# Patient Record
Sex: Male | Born: 1960 | Race: White | Hispanic: No | Marital: Married | Smoking: Former smoker
Health system: Southern US, Community
[De-identification: ages and names within clinical notes are randomized; demographics above are authoritative.]

## PROBLEM LIST (undated history)

## (undated) DIAGNOSIS — E785 Hyperlipidemia, unspecified: Secondary | ICD-10-CM

## (undated) DIAGNOSIS — I10 Essential (primary) hypertension: Secondary | ICD-10-CM

## (undated) DIAGNOSIS — E669 Obesity, unspecified: Secondary | ICD-10-CM

## (undated) HISTORY — DX: Hyperlipidemia, unspecified: E78.5

## (undated) HISTORY — DX: Essential (primary) hypertension: I10

## (undated) HISTORY — DX: Obesity, unspecified: E66.9

---

## 2008-12-31 ENCOUNTER — Ambulatory Visit: Payer: Self-pay | Admitting: Family Medicine

## 2008-12-31 DIAGNOSIS — F17201 Nicotine dependence, unspecified, in remission: Secondary | ICD-10-CM | POA: Insufficient documentation

## 2008-12-31 DIAGNOSIS — E7800 Pure hypercholesterolemia, unspecified: Secondary | ICD-10-CM | POA: Insufficient documentation

## 2008-12-31 DIAGNOSIS — L989 Disorder of the skin and subcutaneous tissue, unspecified: Secondary | ICD-10-CM | POA: Insufficient documentation

## 2008-12-31 DIAGNOSIS — I10 Essential (primary) hypertension: Secondary | ICD-10-CM | POA: Insufficient documentation

## 2008-12-31 DIAGNOSIS — R030 Elevated blood-pressure reading, without diagnosis of hypertension: Secondary | ICD-10-CM | POA: Insufficient documentation

## 2008-12-31 LAB — CONVERTED CEMR LAB
ALT: 41 units/L (ref 0–53)
AST: 25 units/L (ref 0–37)
Albumin: 3.8 g/dL (ref 3.5–5.2)
BUN: 14 mg/dL (ref 6–23)
Chloride: 111 meq/L (ref 96–112)
Cholesterol: 198 mg/dL (ref 0–200)
Eosinophils Relative: 3.4 % (ref 0.0–5.0)
Glucose, Bld: 92 mg/dL (ref 70–99)
HCT: 43.5 % (ref 39.0–52.0)
Hemoglobin: 15.6 g/dL (ref 13.0–17.0)
Lymphs Abs: 2.1 10*3/uL (ref 0.7–4.0)
MCV: 91.1 fL (ref 78.0–100.0)
Monocytes Absolute: 0.7 10*3/uL (ref 0.1–1.0)
Neutro Abs: 3.6 10*3/uL (ref 1.4–7.7)
PSA: 0.76 ng/mL (ref 0.10–4.00)
Platelets: 207 10*3/uL (ref 150.0–400.0)
Potassium: 4.1 meq/L (ref 3.5–5.1)
RDW: 12.9 % (ref 11.5–14.6)
Sodium: 145 meq/L (ref 135–145)
Total Bilirubin: 0.7 mg/dL (ref 0.3–1.2)
WBC: 6.6 10*3/uL (ref 4.5–10.5)

## 2009-02-04 ENCOUNTER — Ambulatory Visit: Payer: Self-pay | Admitting: Family Medicine

## 2009-02-04 DIAGNOSIS — M79609 Pain in unspecified limb: Secondary | ICD-10-CM | POA: Insufficient documentation

## 2009-02-04 DIAGNOSIS — N509 Disorder of male genital organs, unspecified: Secondary | ICD-10-CM | POA: Insufficient documentation

## 2009-02-06 ENCOUNTER — Encounter: Payer: Self-pay | Admitting: Family Medicine

## 2009-07-09 ENCOUNTER — Encounter: Payer: Self-pay | Admitting: Family Medicine

## 2009-12-24 ENCOUNTER — Observation Stay: Payer: Self-pay | Admitting: Internal Medicine

## 2009-12-24 ENCOUNTER — Ambulatory Visit: Payer: Self-pay | Admitting: Cardiovascular Disease

## 2009-12-25 ENCOUNTER — Encounter: Payer: Self-pay | Admitting: Family Medicine

## 2009-12-25 ENCOUNTER — Encounter: Payer: Self-pay | Admitting: Cardiovascular Disease

## 2010-04-21 ENCOUNTER — Ambulatory Visit: Payer: Self-pay | Admitting: Family Medicine

## 2010-04-21 DIAGNOSIS — K5289 Other specified noninfective gastroenteritis and colitis: Secondary | ICD-10-CM | POA: Insufficient documentation

## 2010-06-16 ENCOUNTER — Other Ambulatory Visit: Payer: Self-pay | Admitting: Family Medicine

## 2010-06-16 ENCOUNTER — Ambulatory Visit
Admission: RE | Admit: 2010-06-16 | Discharge: 2010-06-16 | Payer: Self-pay | Source: Home / Self Care | Attending: Family Medicine | Admitting: Family Medicine

## 2010-06-16 DIAGNOSIS — G4730 Sleep apnea, unspecified: Secondary | ICD-10-CM | POA: Insufficient documentation

## 2010-06-16 LAB — CBC WITH DIFFERENTIAL/PLATELET
Basophils Relative: 0.3 % (ref 0.0–3.0)
Eosinophils Relative: 2.3 % (ref 0.0–5.0)
HCT: 45.4 % (ref 39.0–52.0)
Hemoglobin: 15.6 g/dL (ref 13.0–17.0)
Lymphs Abs: 2.2 10*3/uL (ref 0.7–4.0)
MCV: 90.7 fl (ref 78.0–100.0)
Monocytes Absolute: 0.7 10*3/uL (ref 0.1–1.0)
Neutro Abs: 5.2 10*3/uL (ref 1.4–7.7)
RBC: 5 Mil/uL (ref 4.22–5.81)
WBC: 8.4 10*3/uL (ref 4.5–10.5)

## 2010-06-16 LAB — HEPATIC FUNCTION PANEL
ALT: 30 U/L (ref 0–53)
AST: 20 U/L (ref 0–37)
Total Protein: 6.9 g/dL (ref 6.0–8.3)

## 2010-06-16 LAB — BASIC METABOLIC PANEL
BUN: 19 mg/dL (ref 6–23)
Chloride: 105 mEq/L (ref 96–112)
Potassium: 4.5 mEq/L (ref 3.5–5.1)
Sodium: 137 mEq/L (ref 135–145)

## 2010-06-16 LAB — LIPID PANEL
Cholesterol: 143 mg/dL (ref 0–200)
LDL Cholesterol: 93 mg/dL (ref 0–99)

## 2010-06-16 LAB — PSA: PSA: 0.46 ng/mL (ref 0.10–4.00)

## 2010-06-24 NOTE — Letter (Signed)
Summary: [facility]   Imported By: Lanelle Bal 01/06/2010 11:26:39  _____________________________________________________________________  External Attachment:    Type:   Image     Comment:   External Document

## 2010-06-24 NOTE — Letter (Signed)
Summary: Out of Work  [employer] at [facility]  [address] [place], [place] [address]   Phone: [phone]  Fax: [phone]    April 21, 2010   Employee:  Larey Seat    To Whom It May Concern:   For Medical reasons, please excuse the above named employee from work for the following dates:  Start:   04/21/2010  End:   04/23/2010  If you need additional information, please feel free to contact our office.         Sincerely,    Hannah Beat MD

## 2010-06-24 NOTE — Letter (Signed)
Summary: [employer] [place] / PRE-EXISTING CONDITION MED INFO REQUEST  [employer] [place] / PRE-EXISTING CONDITION MED INFO REQUEST   Imported By: Carin Primrose 07/09/2009 14:25:35  _____________________________________________________________________  External Attachment:    Type:   Image     Comment:   External Document

## 2010-06-24 NOTE — Letter (Signed)
Summary: Proof of Physical Form/[employer]  Proof of Physical Form/[employer]   Imported By: Lanelle Bal 05/27/2009 13:49:29  _____________________________________________________________________  External Attachment:    Type:   Image     Comment:   External Document

## 2010-06-24 NOTE — Assessment & Plan Note (Signed)
Summary: DIARRHEA X 4 DAYS   Vital Signs:  Patient profile:   50 year old male Height:      65 inches Weight:      242.0 pounds BMI:     40.42 Temp:     98.8 degrees F oral Pulse rate:   72 / minute Pulse rhythm:   regular BP sitting:   112 / 80  (left arm) Cuff size:   regular  Vitals Entered By: Benny Lennert CMA Duncan Dull) (April 21, 2010 3:43 PM)  History of Present Illness: Chief complaint diarrhea for 4 days  Acute Visit History:      He denies diarrhea, fever, nausea, sinus problems, sore throat, and vomiting.  Urine output has been slightly decreased.  There have been 9 diarrheal stools in the past 24 hours.  He is tolerating clear liquids.        Allergies (verified): No Known Drug Allergies  Past History:  Past medical, surgical, family and social histories (including risk factors) reviewed, and no changes noted (except as noted below).  Past Medical History: Reviewed history from 12/31/2008 and no changes required. HYPERCHOLESTEROLEMIA (ICD-272.0) Hypertension Morbid Obesity Tob Abuse  Family History: Reviewed history from 12/31/2008 and no changes required. M, d/c MI at 19, CAD F, d/c 70's, COPD and DM  Social History: Reviewed history from 12/31/2008 and no changes required. Occupation: former Hotel manager, disabled Married Current Smoker Alcohol use-no Drug use-no  Review of Systems       REVIEW OF SYSTEMS GEN: Acute illness details above. CV: No chest pain or SOB GI: No noted N or V Otherwise, pertinent positives and negatives are noted in the HPI.   Physical Exam  Additional Exam:  GEN: WDWN, NAD, Non-toxic, A & O x 3 HEENT: Atraumatic, Normocephalic. Neck supple. No masses, No LAD. Ears and Nose: No external deformity. CV: RRR, No M/G/R. No JVD. No thrill. No extra heart sounds. ABD: S, NT, ND, +BS - hyperactive. No rebound tenderness. No HSM.  EXTR: No c/c/e NEURO: Normal gait.  PSYCH: Normally interactive. Conversant. Not depressed or  anxious appearing.  Calm demeanor.     Impression & Recommendations:  Problem # 1:  GASTROENTERITIS (ICD-558.9)  His updated medication list for this problem includes:    Diphenoxylate-atropine 2.5-0.025 Mg Tabs (Diphenoxylate-atropine) .Marland Kitchen... 1 by mouth 4 times daily as needed diarrhea  Discussed use of medication and role of diet. Encouraged clear liquids and electrolyte replacement fluids. Instructed to call if any signs of worsening dehydration.   Problem # 2:  DIARRHEA (ICD-787.91)  His updated medication list for this problem includes:    Diphenoxylate-atropine 2.5-0.025 Mg Tabs (Diphenoxylate-atropine) .Marland Kitchen... 1 by mouth 4 times daily as needed diarrhea  Complete Medication List: 1)  Aspirin 81 Mg Tbec (Aspirin) .... One by mouth every day 2)  Simvastatin 40 Mg Tabs (Simvastatin) .Marland Kitchen.. 1 by mouth at bedtime 3)  Lisinopril 20 Mg Tabs (Lisinopril) .Marland Kitchen.. 1 by mouth daily 4)  Diphenoxylate-atropine 2.5-0.025 Mg Tabs (Diphenoxylate-atropine) .Marland Kitchen.. 1 by mouth 4 times daily as needed diarrhea Prescriptions: DIPHENOXYLATE-ATROPINE 2.5-0.025 MG TABS (DIPHENOXYLATE-ATROPINE) 1 by mouth 4 times daily as needed diarrhea  #30 x 0   Entered and Authorized by:   Hannah Beat MD   Signed by:   Hannah Beat MD on 04/21/2010   Method used:   Print then Give to Patient   RxID:   [id number]    Orders Added: 1)  Est. Patient Level IV [24401]    Current Allergies (reviewed today): No  known allergies

## 2010-06-26 NOTE — Assessment & Plan Note (Signed)
Summary: cpx/alc   Vital Signs:  Patient profile:   50 year old male Height:      65 inches Weight:      253.25 pounds BMI:     42.30 Temp:     98.1 degrees F oral Pulse rate:   72 / minute Pulse rhythm:   regular BP sitting:   110 / 70  (left arm) Cuff size:   large  Vitals Entered By: Benny Lennert CMA Duncan Dull) (June 16, 2010 8:05 AM)  History of Present Illness: Chief complaint cpx  50 year old male:  Overall, feels well, but continues to gain weight. Prior to [employer] discharge, was not obese. Now BMI = 42. Poor diet. Snacks a lot, eating cheese when getting home, volume of intake a lot.  OSA: Sometimes wakes up with morning headaches.  Snores very loudly.  Much weight gain 11 pound weight gain in a couple of months Feels tired some during the day Sometimes has to get up at three in the morning Then has to Working 5-3. 7-8 hours, napping in the afternoon.  Napping most days.  Wife notices that he stops breathing in the middle of the night.   Eating habits not the best   Preventive Screening-Counseling & Management  Alcohol-Tobacco     Alcohol drinks/day: 0     Alcohol Counseling: not indicated; patient does not drink     Smoking Status: current     Smoking Cessation Counseling: yes     Smoke Cessation Stage: ready     Packs/Day: 1.0     Tobacco Counseling: to quit use of tobacco products     Passive Smoke Counseling: to avoid passive smoke exposure  Caffeine-Diet-Exercise     Diet Comments: suboptimal, working out more     Diet Counseling: to improve diet; diet is suboptimal     Does Patient Exercise: yes     Type of exercise: just started going to gym     Exercise (avg: min/session): 30-60     Times/week: 3  Hep-HIV-STD-Contraception     Hepatitis Risk: no risk noted     HIV Risk: no risk noted     STD Risk: no risk noted     Contraception Counseling: not indicated; no questions/concerns expressed     Testicular SE Education/Counseling to  perform regular STE      Sexual History:  currently monogamous.        Drug Use:  never.    Allergies (verified): No Known Drug Allergies  Past History:  Past medical, surgical, family and social histories (including risk factors) reviewed, and no changes noted (except as noted below).  Past Medical History: Reviewed history from 12/31/2008 and no changes required. HYPERCHOLESTEROLEMIA (ICD-272.0) Hypertension Morbid Obesity Tob Abuse  Family History: Reviewed history from 12/31/2008 and no changes required. M, d/c MI at 72, CAD F, d/c 70's, COPD and DM  Social History: Reviewed history from 12/31/2008 and no changes required. Occupation: former Hotel manager, disabled Married Current Smoker Alcohol use-no Drug use-no  Review of Systems      See HPI General:  Complains of fatigue and sleep disorder.  Other ROS:  General: Denies fever, chills, sweats, anorexia, weakness, malaise, AS ABOVE Eyes: Denies blurring, vision loss ENT: Denies earache, nasal congestion, nosebleeds, sore throat, and hoarseness.  Cardiovascular: Denies chest pains, palpitations, syncope, dyspnea on exertion,  Respiratory: Denies cough, dyspnea at rest, excessive sputum,wheeezing GI: Denies nausea, vomiting, diarrhea, constipation, change in bowel habits, abdominal pain, melena, hematochezia GU: Denies  dysuria, hematuria, discharge, urinary frequency, urinary hesitancy, nocturia, incontinence, genital sores, decreased libido Musculoskeletal: Denies back pain, joint pain Derm: Denies rash, itching Neuro: Denies  paresthesias, frequent falls, frequent headaches, and difficulty walking.  Psych: Denies depression, anxiety Endocrine: Denies cold intolerance, heat intolerance, polydipsia, polyphagia, polyuria, and unusual weight change.  Heme: Denies enlarged lymph nodes Allergy: No hayfever   Otherwise, the pertinent positives and negatives are listed above and in the HPI, otherwise a full review of  systems has been reviewed and is negative unless noted positive.    Impression & Recommendations:  Problem # 1:  HEALTH MAINTENANCE EXAM (ICD-V70.0) The patient's preventative maintenance and recommended screening tests for an annual wellness exam were reviewed in full today. Brought up to date unless services declined.  Counselled on the importance of diet, exercise, and its role in overall health and mortality. The patient's FH and SH was reviewed, including their home life, tobacco status, and drug and alcohol status.   dramatically needs to lose weight, morbidly obese, exercise and diet changes discussed  Problem # 2:  SLEEP APNEA (ICD-780.57) Assessment: New history highly suggestive of sleep apnea  Consult Dr. Vickey Huger for Sleep Medicine consult.   Orders: Sleep Disorder Referral (Sleep Disorder)  Problem # 3:  HYPERTENSION (ICD-401.9) Assessment: Unchanged  His updated medication list for this problem includes:    Lisinopril 20 Mg Tabs (Lisinopril) .Marland Kitchen... 1 by mouth daily  BP today: 110/70 Prior BP: 112/80 (04/21/2010)  Labs Reviewed: K+: 4.1 (12/31/2008) Creat: : 0.8 (12/31/2008)   Chol: 198 (12/31/2008)   HDL: 31.10 (12/31/2008)   LDL: 147 (12/31/2008)   TG: 101.0 (12/31/2008)  Problem # 4:  HYPERCHOLESTEROLEMIA (ICD-272.0) labs today  His updated medication list for this problem includes:    Simvastatin 40 Mg Tabs (Simvastatin) .Marland Kitchen... 1 by mouth at bedtime  Orders: TLB-Lipid Panel (80061-LIPID) TLB-Hepatic/Liver Function Pnl (80076-HEPATIC)  Complete Medication List: 1)  Aspirin 81 Mg Tbec (Aspirin) .... One by mouth every day 2)  Simvastatin 40 Mg Tabs (Simvastatin) .Marland Kitchen.. 1 by mouth at bedtime 3)  Lisinopril 20 Mg Tabs (Lisinopril) .Marland Kitchen.. 1 by mouth daily  Other Orders: Venipuncture (04540) TLB-BMP (Basic Metabolic Panel-BMET) (80048-METABOL) TLB-CBC Platelet - w/Differential (85025-CBCD) TLB-PSA (Prostate Specific Antigen) (84153-PSA)  Patient  Instructions: 1)  Referral Appointment Information 2)  Day/Date: 3)  Time: 4)  Place/MD: 5)  Address: 6)  Phone/Fax: 7)  Patient given appointment information. Information/Orders faxed/mailed.    Orders Added: 1)  Venipuncture [36415] 2)  TLB-Lipid Panel [80061-LIPID] 3)  TLB-BMP (Basic Metabolic Panel-BMET) [80048-METABOL] 4)  TLB-CBC Platelet - w/Differential [85025-CBCD] 5)  TLB-Hepatic/Liver Function Pnl [80076-HEPATIC] 6)  TLB-PSA (Prostate Specific Antigen) [84153-PSA] 7)  Sleep Disorder Referral [Sleep Disorder] 8)  Est. Patient 40-64 years [99396] 9)  Est. Patient Level III [98119]   Immunization History:  Tetanus/Td Immunization History:    Tetanus/Td:  historical (10/23/2005)   Immunization History:  Tetanus/Td Immunization History:    Tetanus/Td:  Historical (10/23/2005)  Current Allergies (reviewed today): No known allergies   Prevention & Chronic Care Immunizations   Influenza vaccine: Not documented   Influenza vaccine deferral: Not available  (06/16/2010)    Tetanus booster: 10/23/2005: Historical    Pneumococcal vaccine: Not documented  Other Screening   Smoking status: current  (06/16/2010)   Smoking cessation counseling: yes  (06/16/2010)  Lipids   Total Cholesterol: 198  (12/31/2008)   LDL: 147  (12/31/2008)   LDL Direct: Not documented   HDL: 31.10  (12/31/2008)   Triglycerides:  101.0  (12/31/2008)    SGOT (AST): 25  (12/31/2008)   SGPT (ALT): 41  (12/31/2008)   Alkaline phosphatase: 62  (12/31/2008)   Total bilirubin: 0.7  (12/31/2008)  Hypertension   Last Blood Pressure: 110 / 70  (06/16/2010)   Serum creatinine: 0.8  (12/31/2008)   Serum potassium 4.1  (12/31/2008)  Self-Management Support :    Hypertension self-management support: Not documented    Lipid self-management support: Not documented    Physical Exam General Appearance: well developed, well nourished, no acute distress Eyes: conjunctiva and lids normal,  PERRLA, EOMI Ears, Nose, Mouth, Throat: TM clear, nares clear, oral exam WNL Neck: supple, no lymphadenopathy, no thyromegaly, no JVD Respiratory: clear to auscultation and percussion, respiratory effort normal Cardiovascular: regular rate and rhythm, S1-S2, no murmur, rub or gallop, no bruits, peripheral pulses normal and symmetric, no cyanosis, clubbing, edema or varicosities Chest: no scars, masses, tenderness; no asymmetry, skin changes, nipple discharge, no gynecomastia   Gastrointestinal: soft, non-tender; no hepatosplenomegaly, masses; active bowel sounds all quadrants,  no masses, tenderness, hemorrhoids  Genitourinary: no hernia, testicular mass, penile discharge, priapism or prostate enlargement Lymphatic: no cervical, axillary or inguinal adenopathy Musculoskeletal: gait normal, muscle tone and strength WNL, no joint swelling, effusions, discoloration, crepitus  Skin: clear, good turgor, color WNL, no rashes, lesions, or ulcerations Neurologic: normal mental status, normal reflexes, normal strength, sensation, and motion Psychiatric: alert; oriented to person, place and time Other Exam:

## 2011-05-12 ENCOUNTER — Telehealth: Payer: Self-pay | Admitting: Internal Medicine

## 2011-05-12 ENCOUNTER — Ambulatory Visit (INDEPENDENT_AMBULATORY_CARE_PROVIDER_SITE_OTHER): Payer: Managed Care, Other (non HMO) | Admitting: Family Medicine

## 2011-05-12 ENCOUNTER — Encounter: Payer: Self-pay | Admitting: Family Medicine

## 2011-05-12 ENCOUNTER — Encounter: Payer: Self-pay | Admitting: *Deleted

## 2011-05-12 VITALS — BP 120/70 | HR 97 | Temp 99.7°F | Ht 64.5 in | Wt 261.0 lb

## 2011-05-12 DIAGNOSIS — J111 Influenza due to unidentified influenza virus with other respiratory manifestations: Secondary | ICD-10-CM

## 2011-05-12 MED ORDER — OSELTAMIVIR PHOSPHATE 75 MG PO CAPS: 75.0000 mg | ORAL_CAPSULE | Freq: Two times a day (BID) | ORAL | Status: AC

## 2011-05-12 MED ORDER — HYDROCODONE-HOMATROPINE 5-1.5 MG/5ML PO SYRP: ORAL_SOLUTION | ORAL | Status: AC

## 2011-05-12 NOTE — Telephone Encounter (Signed)
Patient's wife called in and stated he has a fever, body aches and congestion.  Made an appointment for today.

## 2011-05-12 NOTE — Progress Notes (Signed)
Patient Name: Kevin [name] Date of Birth: 1960/10/29 Medical Record Number: [id number] Gender: male  History of Present Illness:  Kevin [name] presents with runny nose, sneezing, cough, sore throat, malaise, myalgias, arthralgia, chills, and fever. Less than 3 days ago, started to get worse, then al the sudden, started toget the chills, coughing a lot, whole body feels warm and hurting. Aching.   ? recent exposure to others with similar symptoms.   The patent denies sore throat as the primary complaint. Denies sthortness of breath/wheezing, otalgia, facial pain, abdominal pain, changes in bowel or bladder.  Generally feels terrible  Tmax: 102  PMH, PHS, Allergies, Problem List, Medications, Family History, and Social History have all been reviewed.  Review of Systems: as above, eating and drinking - tolerating PO. Urinating normally. No excessive vomitting or diarrhea. O/w as above.  Physical Exam:  Filed Vitals:   05/12/11 1552  BP: 120/70  Pulse: 97  Temp: 99.7 F (37.6 C)  TempSrc: Oral  Height: 5' 4.5" (1.638 m)  Weight: 261 lb (118.389 kg)  SpO2: 98%    Gen: WDWN, NAD; A & O x3, cooperative. Pleasant.Globally Non-toxic HEENT: Normocephalic and atraumatic. Throat clear, w/o exudate, R TM clear, L TM - good landmarks, No fluid present. rhinnorhea. No frontal or maxillary sinus T. MMM NECK: Anterior cervical  LAD is absent CV: RRR, No M/G/R, cap refill <2 sec PULM: Breathing comfortably in no respiratory distress. no wheezing, crackles, rhonchi ABD: S,NT,ND,+BS. No HSM. No rebound. EXT: No c/c/e PSYCH: Friendly, good eye contact MSK: Nml gait  A/P: 1. Influenza: The patient's clinical exam and history is consistent with a diagnosis of influenza. Tamiflu, hycodan  Supportive care. Fluids. Cough medicines as needed  Anti-pyretics. Detailed in p/i  Infection control emphasized, including OOW or school until AF 24 hours.

## 2011-05-12 NOTE — Patient Instructions (Signed)
INFLUENZA Viral illness: High fever, headache, bad cough, body aches, sore throat, sometimes nausea, vomitting, diarrhea.  Usually quick onset  TREATMENT 1. Decongestant: Sudafed (NOT IF HIGH BLOOD PRESSURE) 2. Nose Sprays: Saline nasal spray, Can use Afrin or Neosynephrine, but no more than 3 days 3. Cough Suppressants: DM portion of cough med, or Strong prescription 4. Expectorant: Liquify Secretions (Guaifenesin) 5. Example: Mucinex (expectorant) or Mucinex-D (expectorant and decongestant) 6. Take all prescribed meds 7. Anti-virals may be used if caught early or in high risk people. (Do NOT if pregnant, breast feeding, seizure disorder) 8. Rest helpful, but move some during day 9. Breathe moist air: Humidifier, Vaporizer, or steam from shower 10. No work or school until no fever for 24 hrs WITH NO Tylenol or Ibuprofen. 11. Pneumonia on top of Flu is possible, if you are doing poorly particularly if a smoker or have COPD, please let us know. 12. Wash hands and cover mouth with cough  

## 2011-06-19 ENCOUNTER — Other Ambulatory Visit: Payer: Self-pay | Admitting: Family Medicine

## 2012-07-18 ENCOUNTER — Other Ambulatory Visit: Payer: Self-pay | Admitting: Family Medicine

## 2012-07-24 ENCOUNTER — Other Ambulatory Visit: Payer: Self-pay | Admitting: Family Medicine

## 2013-07-03 ENCOUNTER — Other Ambulatory Visit: Payer: Self-pay | Admitting: Family Medicine

## 2013-07-03 NOTE — Telephone Encounter (Signed)
Last office visit 05/12/2011.  Ok to refill?

## 2013-07-03 NOTE — Telephone Encounter (Signed)
Ok to refill 30, 1 refill  F/u CPX

## 2013-08-05 ENCOUNTER — Other Ambulatory Visit: Payer: Self-pay | Admitting: Family Medicine

## 2013-08-09 ENCOUNTER — Other Ambulatory Visit: Payer: Self-pay

## 2013-08-09 MED ORDER — LISINOPRIL 20 MG PO TABS: ORAL_TABLET | ORAL | Status: DC

## 2013-08-09 NOTE — Telephone Encounter (Signed)
Kevin [name] left v/m requesting refill lisinopril; pt last seen 05/12/11 and does not have future appt.Please advise.

## 2013-08-09 NOTE — Telephone Encounter (Signed)
F/u cpx  30, #2 ref

## 2013-08-30 ENCOUNTER — Ambulatory Visit (INDEPENDENT_AMBULATORY_CARE_PROVIDER_SITE_OTHER): Payer: Managed Care, Other (non HMO) | Admitting: Family Medicine

## 2013-08-30 ENCOUNTER — Encounter: Payer: Self-pay | Admitting: Family Medicine

## 2013-08-30 VITALS — BP 110/76 | HR 93 | Temp 97.8°F | Ht 64.25 in | Wt 273.0 lb

## 2013-08-30 DIAGNOSIS — I10 Essential (primary) hypertension: Secondary | ICD-10-CM

## 2013-08-30 DIAGNOSIS — Z1211 Encounter for screening for malignant neoplasm of colon: Secondary | ICD-10-CM

## 2013-08-30 DIAGNOSIS — Z0000 Encounter for general adult medical examination without abnormal findings: Secondary | ICD-10-CM

## 2013-08-30 DIAGNOSIS — Z125 Encounter for screening for malignant neoplasm of prostate: Secondary | ICD-10-CM

## 2013-08-30 DIAGNOSIS — Z79899 Other long term (current) drug therapy: Secondary | ICD-10-CM

## 2013-08-30 DIAGNOSIS — F17200 Nicotine dependence, unspecified, uncomplicated: Secondary | ICD-10-CM

## 2013-08-30 DIAGNOSIS — E7800 Pure hypercholesterolemia, unspecified: Secondary | ICD-10-CM

## 2013-08-30 DIAGNOSIS — G4730 Sleep apnea, unspecified: Secondary | ICD-10-CM

## 2013-08-30 MED ORDER — LISINOPRIL 20 MG PO TABS: ORAL_TABLET | ORAL | Status: DC

## 2013-08-30 MED ORDER — SIMVASTATIN 40 MG PO TABS: 40.0000 mg | ORAL_TABLET | Freq: Every day | ORAL | Status: DC

## 2013-08-30 NOTE — Progress Notes (Signed)
Date:  08/30/2013   Name:  Kevin [name]   DOB:  [birth year]   MRN:  [id number] Gender: male Age: 53 y.o.  Primary Physician:  Hannah Beat, MD   Chief Complaint: Medication Refill   Subjective:   History of Present Illness:  Kevin [name] is a 53 y.o. pleasant patient who presents with the following:  Preventative Health Maintenance Visit:  Health Maintenance Summary Reviewed and updated, unless pt declines services.  Tobacco History Reviewed. Still smoking about every other day, but he has cut down. Alcohol: No concerns, no excessive use Exercise Habits: minimal STD concerns: no risk or activity to increase risk Drug Use: None Encouraged self-testicular check  Health Maintenance  Topic Date Due  . Colonoscopy  11/30/2011  . Influenza Vaccine  12/23/2013  . Tetanus/tdap  10/24/2015    Immunization History  Administered Date(s) Administered  . Td 10/23/2005    Patient Active Problem List   Diagnosis Date Noted  . Severe obesity (BMI >= 40) 08/31/2013  . SLEEP APNEA 06/16/2010  . HYPERCHOLESTEROLEMIA 12/31/2008  . TOBACCO USE 12/31/2008  . HYPERTENSION 12/31/2008    Past Medical History  Diagnosis Date  . Hyperlipidemia   . Hypertension   . Obesity     History reviewed. No pertinent past surgical history.  History   Social History  . Marital Status: Married    Spouse Name: N/A    Number of Children: N/A  . 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 Smoking status: Current Some Day Smoker  . Smokeless tobacco: Never Used  . Alcohol Use: No  . Drug Use: No  . Sexual Activity: Not on file   Other Topics Concern  . Not on file   Social History Narrative  . No narrative on file    Family History  Problem Relation Age of Onset  . Heart attack Mother   . Coronary artery disease Mother   . COPD Father   . Diabetes Father     No Known Allergies  Medication list has been reviewed  and updated.  Review of Systems:  General: Denies fever, chills, sweats. No significant weight loss. Eyes: Denies blurring,significant itching ENT: Denies earache, sore throat, and hoarseness. Cardiovascular: Denies chest pains, palpitations, dyspnea on exertion Respiratory: Denies cough, dyspnea at rest,wheeezing Breast: no concerns about lumps GI: Denies nausea, vomiting, diarrhea, constipation, change in bowel habits, abdominal pain, melena, hematochezia GU: Denies penile discharge, ED, urinary flow / outflow problems. No STD concerns. Musculoskeletal: Denies back pain, joint pain Derm: Denies rash, itching Neuro: Denies  paresthesias, frequent falls, frequent headaches Psych: Denies depression, anxiety Endocrine: Denies cold intolerance, heat intolerance, polydipsia Heme: Denies enlarged lymph nodes Allergy: No hayfever  Objective:   Physical Examination: BP 110/76  Pulse 93  Temp(Src) 97.8 F (36.6 C) (Oral)  Ht 5' 4.25" (1.632 m)  Wt 273 lb (123.832 kg)  BMI 46.49 kg/m2 Ideal Body Weight: Weight in (lb) to have BMI = 25: 146.5  GEN: well developed, well nourished, no acute distress Eyes: conjunctiva and lids normal, PERRLA, EOMI ENT: TM clear, nares clear, oral exam WNL Neck: supple, no lymphadenopathy, no thyromegaly, no JVD Pulm: clear to auscultation and percussion, respiratory effort normal CV: regular rate and rhythm, S1-S2, no murmur, rub or gallop, no bruits, peripheral pulses normal and symmetric, no cyanosis, clubbing, edema or varicosities GI: soft, non-tender; no hepatosplenomegaly, masses; active bowel sounds all quadrants GU: no hernia, testicular  mass, penile discharge Lymph: no cervical, axillary or inguinal adenopathy MSK: gait normal, muscle tone and strength WNL, no joint swelling, effusions, discoloration, crepitus  SKIN: clear, good turgor, color WNL, no rashes, lesions, or ulcerations Neuro: normal mental status, normal strength, sensation, and  motion Psych: alert; oriented to person, place and time, normally interactive and not anxious or depressed in appearance.  Needs current labs.  Lipids:    Component Value Date/Time   CHOL 143 06/16/2010 0834   TRIG 104.0 06/16/2010 0834   HDL 28.90* 06/16/2010 0834   VLDL 20.8 06/16/2010 0834   CHOLHDL 5 06/16/2010 0834    CBC:    Component Value Date/Time   WBC 8.4 06/16/2010 0834   HGB 15.6 06/16/2010 0834   HCT 45.4 06/16/2010 0834   PLT 238.0 06/16/2010 0834   MCV 90.7 06/16/2010 0834   NEUTROABS 5.2 06/16/2010 0834   LYMPHSABS 2.2 06/16/2010 0834   MONOABS 0.7 06/16/2010 0834   EOSABS 0.2 06/16/2010 0834   BASOSABS 0.0 06/16/2010 0834    Basic Metabolic Panel:    Component Value Date/Time   NA 137 06/16/2010 0834   K 4.5 06/16/2010 0834   CL 105 06/16/2010 0834   CO2 24 06/16/2010 0834   BUN 19 06/16/2010 0834   CREATININE 0.9 06/16/2010 0834   GLUCOSE 85 06/16/2010 0834   CALCIUM 9.2 06/16/2010 0834    Lab Results  Component Value Date   ALT 30 06/16/2010   AST 20 06/16/2010   ALKPHOS 62 06/16/2010   BILITOT 0.7 06/16/2010    No results found for this basename: TSH    Lab Results  Component Value Date   PSA 0.46 06/16/2010   PSA 0.76 12/31/2008    Assessment & Plan:   Health Maintenance Exam: The patient's preventative maintenance and recommended screening tests for an annual wellness exam were reviewed in full today. Brought up to date unless services declined.  Counselled on the importance of diet, exercise, and its role in overall health and mortality. The patient's FH and SH was reviewed, including their home life, tobacco status, and drug and alcohol status.  Routine general medical examination at a health care facility  HYPERCHOLESTEROLEMIA - Plan: Lipid panel  HYPERTENSION  SLEEP APNEA  TOBACCO USE  OBESITY, MORBID  Special screening for malignant neoplasm of prostate - Plan: PSA  Encounter for long-term (current) use of other medications - Plan: Basic  metabolic panel, CBC with Differential, Hepatic function panel  Special screening for malignant neoplasms, colon - Plan: Ambulatory referral to Gastroenterology  Health maintenance. Labs Refill meds Colon  F/u with Dr. Vickey Hugerohmeier about sleep study  5 minutes spent in counselling: The patient does smoke cigarettes, and we discussed that tobacco is harmful to one's overall health, and I made the recommendation to quit tobacco products.   Follow-up: Return in about 1 year (around 08/31/2014).  New Prescriptions   No medications on file   Orders Placed This Encounter  Procedures  . Basic metabolic panel  . CBC with Differential  . Hepatic function panel  . Lipid panel  . PSA  . Ambulatory referral to Gastroenterology    Signed,  Karleen HampshireSpencer T. Starkisha Tullis, MD, CAQ Sports Medicine  [facility] [address] [place] [address] Phone: [phone] Fax: [phone]  Patient's Medications  New Prescriptions   No medications on file  Previous Medications   ASPIRIN 81 MG TABLET    Take 81 mg by mouth daily.  MULTIPLE VITAMINS-MINERALS (MULTIVITAMIN WITH MINERALS) TABLET    Take 1 tablet by mouth daily.  Modified Medications   Modified Medication Previous Medication   LISINOPRIL (PRINIVIL,ZESTRIL) 20 MG TABLET lisinopril (PRINIVIL,ZESTRIL) 20 MG tablet      TAKE 1 TABLET BY MOUTH EVERY DAY    TAKE 1 TABLET BY MOUTH EVERY DAY   SIMVASTATIN (ZOCOR) 40 MG TABLET simvastatin (ZOCOR) 40 MG tablet      Take 1 tablet (40 mg total) by mouth daily.    TAKE 1 TABLET BY MOUTH AT BEDTIME  Discontinued Medications   No medications on file

## 2013-08-30 NOTE — Patient Instructions (Addendum)
Call Dr. Oliva Bustardohmeier's office to set up a sleep study.  REFERRALS TO SPECIALISTS, SPECIAL TESTS (MRI, CT, ULTRASOUNDS)  GO THE WAITING ROOM AND TELL CHECK IN YOU NEED HELP WITH A REFERRAL. Either MARION or LINDA will help you set it up.  If it is between 1-2 PM they may be at lunch.  After 5 PM, they will likely be at home.  They will call you, so please make sure the office has your correct phone number.  Referrals sometimes can be done same day if urgent, but others can take 2 or 3 days to get an appointment. Starting in 2015, many of the new Medicare insurance plans and Affordable Care Act (Obamacare) Health plans offered take much longer for referrals. They have added additional paperwork and steps.  MRI's and CT's can take up to a week for the test. (Emergencies like strokes take precedence. I will tell you if you have an emergency.)   If your referral is to an [facility] [place] [facility], their office may contact you directly prior to our office reaching you.  -- Examples: [facility], [facility], [facility], [facility], [facility], and many more.  Specialist appointment times vary a great deal, mostly on the specialist's schedule and if they have openings. -- Our office tries to get you in as fast as possible. -- Some specialists have very long wait times. (Example. Dermatology. Usually months) -- If you have a true emergency like new cancer, we work to get you in ASAP.

## 2013-08-30 NOTE — Progress Notes (Signed)
Pre visit review using our clinic review tool, if applicable. No additional management support is needed unless otherwise documented below in the visit note. 

## 2013-08-31 ENCOUNTER — Encounter: Payer: Self-pay | Admitting: *Deleted

## 2013-08-31 DIAGNOSIS — Z6841 Body Mass Index (BMI) 40.0 and over, adult: Secondary | ICD-10-CM

## 2013-08-31 HISTORY — DX: Body Mass Index (BMI) 40.0 and over, adult: Z684

## 2013-08-31 LAB — CBC WITH DIFFERENTIAL/PLATELET
Basophils Absolute: 0 10*3/uL (ref 0.0–0.1)
Basophils Relative: 0.3 % (ref 0.0–3.0)
Eosinophils Absolute: 0.3 10*3/uL (ref 0.0–0.7)
Eosinophils Relative: 2.9 % (ref 0.0–5.0)
HEMATOCRIT: 46 % (ref 39.0–52.0)
HEMOGLOBIN: 15.7 g/dL (ref 13.0–17.0)
LYMPHS PCT: 26.2 % (ref 12.0–46.0)
Lymphs Abs: 2.6 10*3/uL (ref 0.7–4.0)
MCHC: 34.1 g/dL (ref 30.0–36.0)
MCV: 90.6 fl (ref 78.0–100.0)
Monocytes Absolute: 0.6 10*3/uL (ref 0.1–1.0)
Monocytes Relative: 6 % (ref 3.0–12.0)
NEUTROS ABS: 6.5 10*3/uL (ref 1.4–7.7)
Neutrophils Relative %: 64.6 % (ref 43.0–77.0)
PLATELETS: 249 10*3/uL (ref 150.0–400.0)
RBC: 5.07 Mil/uL (ref 4.22–5.81)
RDW: 13 % (ref 11.5–14.6)
WBC: 10.1 10*3/uL (ref 4.5–10.5)

## 2013-08-31 LAB — LIPID PANEL
CHOLESTEROL: 146 mg/dL (ref 0–200)
HDL: 30.6 mg/dL — ABNORMAL LOW (ref >39.00)
LDL Cholesterol: 68 mg/dL (ref 0–99)
TRIGLYCERIDES: 236 mg/dL — AB (ref 0.0–149.0)
Total CHOL/HDL Ratio: 5
VLDL: 47.2 mg/dL — AB (ref 0.0–40.0)

## 2013-08-31 LAB — BASIC METABOLIC PANEL
BUN: 15 mg/dL (ref 6–23)
CALCIUM: 9.7 mg/dL (ref 8.4–10.5)
CO2: 29 mEq/L (ref 19–32)
Chloride: 103 mEq/L (ref 96–112)
Creatinine, Ser: 1 mg/dL (ref 0.4–1.5)
GFR: 85.45 mL/min (ref >60.00)
GLUCOSE: 88 mg/dL (ref 70–99)
Potassium: 4.6 mEq/L (ref 3.5–5.1)
Sodium: 140 mEq/L (ref 135–145)

## 2013-08-31 LAB — HEPATIC FUNCTION PANEL
ALBUMIN: 4.1 g/dL (ref 3.5–5.2)
ALT: 43 U/L (ref 0–53)
AST: 23 U/L (ref 0–37)
Alkaline Phosphatase: 60 U/L (ref 39–117)
Bilirubin, Direct: 0.1 mg/dL (ref 0.0–0.3)
Total Bilirubin: 0.6 mg/dL (ref 0.3–1.2)
Total Protein: 7.1 g/dL (ref 6.0–8.3)

## 2013-08-31 LAB — PSA: PSA: 0.71 ng/mL (ref 0.10–4.00)

## 2013-12-22 ENCOUNTER — Ambulatory Visit: Payer: Self-pay | Admitting: Unknown Physician Specialty

## 2013-12-22 LAB — HM COLONOSCOPY

## 2013-12-26 ENCOUNTER — Encounter: Payer: Self-pay | Admitting: Family Medicine

## 2013-12-26 LAB — PATHOLOGY REPORT

## 2014-01-30 ENCOUNTER — Ambulatory Visit (INDEPENDENT_AMBULATORY_CARE_PROVIDER_SITE_OTHER): Admitting: Family Medicine

## 2014-01-30 ENCOUNTER — Encounter: Payer: Self-pay | Admitting: Family Medicine

## 2014-01-30 VITALS — BP 122/80 | HR 92 | Temp 98.1°F | Wt 282.0 lb

## 2014-01-30 DIAGNOSIS — H109 Unspecified conjunctivitis: Secondary | ICD-10-CM | POA: Insufficient documentation

## 2014-01-30 MED ORDER — ERYTHROMYCIN 5 MG/GM OP OINT: 1.0000 "application " | TOPICAL_OINTMENT | Freq: Three times a day (TID) | OPHTHALMIC | Status: DC

## 2014-01-30 NOTE — Patient Instructions (Addendum)
Use the erythromycin ointment in the left eye 3 times a day.  This should get better gradually.  If not, then notify us.  Handwashing; try to keep you hands out of your eyes.  Take care.  Glad to see you.

## 2014-01-30 NOTE — Assessment & Plan Note (Signed)
Presumed infectious conjunctivitis. No corneal defect seen on staining. D/w pt.  Use erythromycin ointment for now and f/u prn.  He agrees.

## 2014-01-30 NOTE — Progress Notes (Signed)
Pre visit review using our clinic review tool, if applicable. No additional management support is needed unless otherwise documented below in the visit note.  L eye sx.  Started about 2 days ago.  Unclear if he had a grain of sand in his eye initially.  Was mildly red, flushed the eye at that point. Still with tearing. Whitish green drainage.  Eye isn't matted in the AM. No R sided.  Irritated L eye not but pain.  No vision loss but slightly blurry vision in L eye, constant.  No trauma.   Meds, vitals, and allergies reviewed.   ROS: See HPI.  Otherwise, noncontributory.  nad PERRL, EOMI No FB in either eye. L eye with diffuse injection but less redness at the edge of the iris.  L eye stained but no defect seen, dec sensation of irritation after topical numbing Fundus wnl B 20/20 R eye with glasses 20/25 (20/20 -3) L eye with glasses

## 2014-05-30 ENCOUNTER — Encounter: Payer: Self-pay | Admitting: Family Medicine

## 2014-05-30 ENCOUNTER — Ambulatory Visit (INDEPENDENT_AMBULATORY_CARE_PROVIDER_SITE_OTHER): Admitting: Family Medicine

## 2014-05-30 VITALS — BP 110/62 | HR 93 | Temp 99.6°F | Ht 64.25 in | Wt 271.5 lb

## 2014-05-30 DIAGNOSIS — J189 Pneumonia, unspecified organism: Secondary | ICD-10-CM

## 2014-05-30 DIAGNOSIS — G4733 Obstructive sleep apnea (adult) (pediatric): Secondary | ICD-10-CM

## 2014-05-30 DIAGNOSIS — F17200 Nicotine dependence, unspecified, uncomplicated: Secondary | ICD-10-CM

## 2014-05-30 DIAGNOSIS — Z720 Tobacco use: Secondary | ICD-10-CM

## 2014-05-30 MED ORDER — PREDNISONE 20 MG PO TABS
ORAL_TABLET | ORAL | Status: DC
Start: 2014-05-30 — End: 2014-06-07

## 2014-05-30 MED ORDER — ALBUTEROL SULFATE HFA 108 (90 BASE) MCG/ACT IN AERS: 2.0000 | INHALATION_SPRAY | Freq: Four times a day (QID) | RESPIRATORY_TRACT | Status: DC | PRN

## 2014-05-30 MED ORDER — LEVOFLOXACIN 500 MG PO TABS: 500.0000 mg | ORAL_TABLET | Freq: Every day | ORAL | Status: DC

## 2014-05-30 NOTE — Progress Notes (Signed)
Pre visit review using our clinic review tool, if applicable. No additional management support is needed unless otherwise documented below in the visit note. 

## 2014-05-30 NOTE — Progress Notes (Signed)
Dr. Karleen Hampshire T. Naysha Sholl, MD, CAQ Sports Medicine Primary Care and Sports Medicine [address] [place], [address] Phone: [phone] Fax: [phone]  05/30/2014  Patient: Kevin [name], MRN: [id number], DOB: Sep 25, 1960, 54 y.o.  Primary Physician:  Hannah Beat, MD  Chief Complaint: Follow-up  Subjective:   Kevin [name] is a 54 y.o. very pleasant male patient who presents with the following:  Coughing bad, not coughing anything up, temp and up and down. Not sleeping at all. Not bringing anything up.   He is here with his wife, and the patient was seen 4 days ago at an urgent care and was diagnosed with bronchitis and has been treated with amoxicillin and some cough medication. He and his wife thinks that he is worsened intervally since that time, and he is sweating, and he is interval he had a temperature. The highest it has been is been around 100-101F.  He is somewhat short of breath, and he has not been smoking over the last week. He is wheezing somewhat also.  Past Medical History, Surgical History, Social History, Family History, Problem List, Medications, and Allergies have been reviewed and updated if relevant.  ROS: GEN: Acute illness details above GI: Tolerating PO intake GU: maintaining adequate hydration and urination Pulm: above Interactive and getting along well at home.  Otherwise, ROS is as per the HPI.   Objective:   BP 110/62 mmHg  Pulse 93  Temp(Src) 99.6 F (37.6 C) (Oral)  Ht 5' 4.25" (1.632 m)  Wt 271 lb 8 oz (123.152 kg)  BMI 46.24 kg/m2  SpO2 91%   GEN: A and O x 3. WDWN. NAD.    ENT: Nose clear, ext NML.  No LAD.  No JVD.  TM's clear. Oropharynx clear.  PULM: Normal WOB, no distress. Bilateral crackles lower lobes and middle lobe. Intermittent wheezing and decreased breath sounds. CV: RRR, no M/G/R, No rubs, No JVD.   EXT: warm and well-perfused, No c/c/e. PSYCH: Pleasant and conversant.    Laboratory and Imaging  Data:  Assessment and Plan:   Bilateral pneumonia  Obstructive sleep apnea - Plan: Ambulatory referral to Neurology  TOBACCO USE  Severe obesity (BMI >= 40)  Clinically, bilateral pneumonia, history of heavy tobacco abuse. May have some underlying COPD. Wheezing. Beta agonists and additionally add some prednisone for one week. Change antibiotics to Levaquin.  He has not had followup regarding his probable sleep apnea.  Follow-up: No Follow-up on file.  New Prescriptions   ALBUTEROL (PROVENTIL HFA;VENTOLIN HFA) 108 (90 BASE) MCG/ACT INHALER    Inhale 2 puffs into the lungs every 6 (six) hours as needed for wheezing or shortness of breath.   LEVOFLOXACIN (LEVAQUIN) 500 MG TABLET    Take 1 tablet (500 mg total) by mouth daily.   PREDNISONE (DELTASONE) 20 MG TABLET    2 tabs a day for 4 days, then 1 tab for 3 days   Orders Placed This Encounter  Procedures  . Ambulatory referral to Neurology    Signed,  Karleen Hampshire T. Glenmore Karl, MD   Patient's Medications  New Prescriptions   ALBUTEROL (PROVENTIL HFA;VENTOLIN HFA) 108 (90 BASE) MCG/ACT INHALER    Inhale 2 puffs into the lungs every 6 (six) hours as needed for wheezing or shortness of breath.   LEVOFLOXACIN (LEVAQUIN) 500 MG TABLET    Take 1 tablet (500 mg total) by mouth daily.   PREDNISONE (DELTASONE) 20 MG TABLET    2 tabs a day for 4 days,  then 1 tab for 3 days  Previous Medications   ASPIRIN 81 MG TABLET    Take 81 mg by mouth daily.     CHERATUSSIN AC 100-10 MG/5ML SYRUP    Take 10 mLs by mouth every 4 (four) hours as needed.   LIDOCAINE (XYLOCAINE) 2 % SOLUTION    15 mLs. Take 15 ml by mouth every 6 hrs. and swish and spit out   LISINOPRIL (PRINIVIL,ZESTRIL) 20 MG TABLET    TAKE 1 TABLET BY MOUTH EVERY DAY   MULTIPLE VITAMINS-MINERALS (MULTIVITAMIN WITH MINERALS) TABLET    Take 1 tablet by mouth daily.   SIMVASTATIN (ZOCOR) 40 MG TABLET    Take 1 tablet (40 mg total) by mouth daily.  Modified Medications   No medications on  file  Discontinued Medications   AMOXICILLIN (AMOXIL) 875 MG TABLET    Take 875 mg by mouth every 12 (twelve) hours.   ERYTHROMYCIN OPHTHALMIC OINTMENT    Place 1 application into the left eye 3 (three) times daily.

## 2014-05-31 ENCOUNTER — Telehealth: Payer: Self-pay | Admitting: Family Medicine

## 2014-05-31 NOTE — Telephone Encounter (Signed)
emmi mailed  °

## 2014-06-07 ENCOUNTER — Ambulatory Visit (INDEPENDENT_AMBULATORY_CARE_PROVIDER_SITE_OTHER): Admitting: Neurology

## 2014-06-07 ENCOUNTER — Encounter: Payer: Self-pay | Admitting: Neurology

## 2014-06-07 DIAGNOSIS — G4710 Hypersomnia, unspecified: Secondary | ICD-10-CM

## 2014-06-07 DIAGNOSIS — J441 Chronic obstructive pulmonary disease with (acute) exacerbation: Secondary | ICD-10-CM

## 2014-06-07 DIAGNOSIS — R0683 Snoring: Secondary | ICD-10-CM

## 2014-06-07 DIAGNOSIS — J322 Chronic ethmoidal sinusitis: Secondary | ICD-10-CM

## 2014-06-07 DIAGNOSIS — G4730 Sleep apnea, unspecified: Secondary | ICD-10-CM

## 2014-06-07 NOTE — Progress Notes (Addendum)
SLEEP MEDICINE CLINIC   Provider:  Melvyn Novas, M D  Referring Provider: Hannah Beat, MD Primary Care Physician:  Hannah Beat, MD  Chief Complaint  Patient presents with  . NP [place]  Sleep consult    Rm 10, Wife    HPI:  Kevin [name] is a 54 y.o. male seen here as a referral  from Dr. Patsy Lager for a sleep medicine evaluation,   Chief complaint is excessive daytime sleepiness, culminating in multiple daytime naps ,worsening over the last 12 month. Snoring since 2010.      The patient usually reaches home after work at about 4 PM and to at that time likes to watch TV for a brief time "until dinner is ready" -often his wife has to wake him when dinner finally is ready ,because he fell inadvertently asleep and is loudly snoring . [name] report that he feels better after a power nap, refreshed for about an hour, then will "" crash again and feel excessively sleepy".  He sometimes has 2-3 power naps before finally going to the bedroom at about 9:00 at night. He will fall asleep in less than 30 minutes. He describes his bedroom is cool, quiet and dark. He sleeps on one pillow only, he falls asleep usually on his right side, but when he wakes up at night he will find himself supine. He has one nocturia at night, he falls asleep again, he snores again.  She had witnessed him to snore so loudly, that the couple has actually slept in separate rooms.  She witnesses frequent apneas.  He wakes in the morning with a very dry mouth, some nights with headaches, wakes up spontaneously at 4 .30 in AM to get ready for work.  He is not refreshed.   He works from 6 AM to 3 PM, no change in shift work.  One cup of coffee in the morning, drives to work. He does not drink caffeine later than noon, likes iced tea mixed with cool aid at lunch.  he quit smoking only 14 days ago .No ETOH.   He has chronic nasal airway obstruction and is a mouth breather. He has seasonal allergies, and  he is prone to respiratory cold and sinuitis. He has frequent bronchitis. He is a thunderous snorer independent of his sleep position.  He didn't snore in 2007, but begun in 2010 after significant weight gain.    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  Married for 8 years, no children, no known family history of apnea or other sleep disorders.  He was never a sleep walker , but on occasion is sleep talking.    Review of Systems: Out of a complete 14 system review, the patient complains of only the following symptoms, and all other reviewed systems are negative. The patient endorsed weight gain, leg swelling hearing loss with tinnitus, coughing and wheezing and nasal congestion at this time it is worse because he just overcame a sinusitis-bronchitis. He has joint pain is a mostly in the knees, he feels that he is not refreshed in the morning and does not get enough sleep be restored. He is a decreased level of energy.  His existing diagnosis of hypertension hypercholesterolemia has been mentioned as well controlled on the current medications which include Zocor, lisinopril. He is just now due to the recent bronchitis on an albuterol sulfate inhaler and has been taking guaifenesin with codeine to suppress cough at night as needed.  Levaquin antibiotic which was just started 5 days ago.  Epworth score 10 , Fatigue severity score 30  , depression score 2    History   Social History  . Marital Status: Married    Spouse Name: [name]    Number of Children: N/A  . 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 Smoking status: Former Smoker -- 1.00 packs/day for 15 years    Types: Cigarettes    Quit date: 05/25/2014  . Smokeless tobacco: Never Used  . Alcohol Use: No  . Drug Use: No  . Sexual Activity: Not on file   Other Topics Concern  . Not on file   Social History Narrative   Former [employer]-2009, E7    Caffeine 4-5 cups daily avg.   Married no kids.     Family History  Problem Relation Age of Onset  . Heart attack Mother   . Coronary artery disease Mother   . COPD Father   . Diabetes Father     Past Medical History  Diagnosis Date  . Hyperlipidemia   . Hypertension   . Obesity     No past surgical history on file.  Current Outpatient Prescriptions  Medication Sig Dispense Refill  . albuterol (PROVENTIL HFA;VENTOLIN HFA) 108 (90 BASE) MCG/ACT inhaler Inhale 2 puffs into the lungs every 6 (six) hours as needed for wheezing or shortness of breath. 1 Inhaler 3  . aspirin 81 MG tablet Take 81 mg by mouth daily.      Marland Kitchen. levofloxacin (LEVAQUIN) 500 MG tablet Take 1 tablet (500 mg total) by mouth daily. 10 tablet 0  . lisinopril (PRINIVIL,ZESTRIL) 20 MG tablet TAKE 1 TABLET BY MOUTH EVERY DAY 90 tablet 3  . Multiple Vitamins-Minerals (MULTIVITAMIN WITH MINERALS) tablet Take 1 tablet by mouth daily.    . simvastatin (ZOCOR) 40 MG tablet Take 1 tablet (40 mg total) by mouth daily. 90 tablet 3   No current facility-administered medications for this visit.    Allergies as of 06/07/2014  . (No Known Allergies)    Vitals: BP 123/79 mmHg  Pulse 71  Resp 14  Ht 5' 5.5" (1.664 m)  Wt 272 lb (123.378 kg)  BMI 44.56 kg/m2 Last Weight:  Wt Readings from Last 1 Encounters:  06/07/14 272 lb (123.378 kg)       Last Height:   Ht Readings from Last 1 Encounters:  06/07/14 5' 5.5" (1.664 m)    Physical exam:  General: The patient is awake, alert and appears not in acute distress. The patient is well groomed. He has a full beard,  Head: Normocephalic, atraumatic. Neck is supple. Mallampati 5  neck circumference: 19. Nasal airflow very restricted, , TMJ is not evident . Retrognathia is not seen. Crowded , small jaw.  Cardiovascular:  Regular rate and rhythm , without  murmurs or carotid bruit, and without distended neck veins. Respiratory: Lungs are clear to auscultation. Skin:   Without evidence of  Ankle edema, or rash Trunk: BMI is elevated ,patient has normal posture.  Neurologic exam : The patient is awake and alert, oriented to place and time.   Memory subjective   described as intact. There is a normal attention span & concentration ability. Speech is fluent with dysphonia . Mood and affect are appropriate.  Cranial nerves: Pupils are equal and briskly reactive to light. Funduscopic exam without  evidence of pallor or edema. Extraocular movements  in  vertical and horizontal planes intact . Noted  Nystagmus in horizonatl gaze on left eye only, with fast componant to the midline.  Visual fields by finger perimetry are intact. Hearing to finger rub intact.  Rinne  Weber normal. Facial sensation intact to fine touch. Facial motor strength is symmetric and tongue and uvula move midline. Shoulder shrug and tongue protrusion normal.   Motor exam:  Normal tone, muscle bulk and symmetric,strength in all extremities Sensory:  Fine touch, pinprick and vibration were tested in all extremities. Proprioception is  normal. Coordination: Rapid alternating movements in the fingers/hands is normal. Finger-to-nose maneuver  normal without evidence of ataxia, dysmetria or tremor. Gait and station: Patient walks without assistive device and is able unassisted to climb up to the exam table. Strength within normal limits. Stance is  Wide based , stable and normal.  Deep tendon reflexes: in the  upper and lower extremities are  Very brisk in all extremities, no clonus, but 3-3 plus. symmetric and intact.  Babinski maneuver response is downgoing.    The patient was advised of the nature of the diagnosed sleep disorder , the treatment options and risks for general a health and wellness arising from not treating the condition. Visit duration face to face was 45 minutes with more than 50 % of the time spent in discussion of risk factor for apnea, testing modalities and treatment options,  such as CPAP, ENT or dental devices.   Assessment:  After physical and neurologic examination, review of laboratory studies, imaging, neurophysiology testing and pre-existing records, assessment is  #1 OSA - the patient has been witnessed to have apnea and  is known to be a loud snorer. He is very likely suffering from obstructive sleep apnea based on the following risk factors elevated BMI, large and excise, high-grade Mallampati, nasal congestion or even obstruction with frequent sinusitis, he suffered recently a bronchitis-pneumonia which in addition will aggravate his nocturnal sleep wheezing.  #2 Obesity:  risk factor management would include weight loss, he has morbid obesity, likely he will require CPAP use. #3 additional pulmonary and ENT health factors, he has bronchitis, sinusitis and pneumonia, needed albuterol frequently - Just stopped smoking. Smoking cessation needs to be maintained.     Plan:  Treatment plan and additional workup :  SPLIT study at AHI 15 and score at [facility] 4% , Co2 only if available.      Porfirio Mylar Lauralye Kinn MD  06/07/2014

## 2014-06-07 NOTE — Patient Instructions (Signed)
Place sleep apnea patient instructions here.  

## 2014-07-03 ENCOUNTER — Ambulatory Visit (INDEPENDENT_AMBULATORY_CARE_PROVIDER_SITE_OTHER): Admitting: Neurology

## 2014-07-03 DIAGNOSIS — J322 Chronic ethmoidal sinusitis: Secondary | ICD-10-CM

## 2014-07-03 DIAGNOSIS — G4710 Hypersomnia, unspecified: Secondary | ICD-10-CM

## 2014-07-03 DIAGNOSIS — G4730 Sleep apnea, unspecified: Secondary | ICD-10-CM

## 2014-07-03 DIAGNOSIS — J441 Chronic obstructive pulmonary disease with (acute) exacerbation: Secondary | ICD-10-CM

## 2014-07-03 DIAGNOSIS — R0683 Snoring: Secondary | ICD-10-CM

## 2014-07-03 NOTE — Sleep Study (Signed)
Please see the scanned sleep study interpretation located in the Procedure tab within the Chart Review section. 

## 2014-07-16 DIAGNOSIS — G4730 Sleep apnea, unspecified: Secondary | ICD-10-CM

## 2014-07-16 DIAGNOSIS — G4710 Hypersomnia, unspecified: Secondary | ICD-10-CM | POA: Insufficient documentation

## 2014-07-18 ENCOUNTER — Encounter: Payer: Self-pay | Admitting: Neurology

## 2014-07-18 ENCOUNTER — Other Ambulatory Visit: Payer: Self-pay | Admitting: Neurology

## 2014-07-18 ENCOUNTER — Telehealth: Payer: Self-pay | Admitting: Neurology

## 2014-07-18 DIAGNOSIS — G4733 Obstructive sleep apnea (adult) (pediatric): Secondary | ICD-10-CM

## 2014-07-18 NOTE — Telephone Encounter (Signed)
Called the patient to review the results from his recent sleep study.  He is aware of the finding of very severe obstructive sleep apnea with hypoventilation.  Pt understands that he did very well on CPAP, bringing his AHI to 0.0 and improving O2 to 90%.  Pt is in [facility] with [facility].  A copy of this sleep study report was sent to Dr. Karleen HampshireSpencer Copland.  The patient has elected to receive his report via mail.

## 2014-07-30 ENCOUNTER — Telehealth: Payer: Self-pay | Admitting: Family Medicine

## 2014-07-30 DIAGNOSIS — G4733 Obstructive sleep apnea (adult) (pediatric): Secondary | ICD-10-CM

## 2014-07-30 NOTE — Telephone Encounter (Signed)
This referral was presumably processed by a former employee [employer].  I have contacted both patient and [facility] and re-processed the referral for patient to get his CPAP equipment

## 2014-07-30 NOTE — Telephone Encounter (Signed)
Kevin KidneyDebra called stated they received results from sleep study in the mail.  And wanted to know what they needs to do to get the CPAP machine.

## 2014-07-30 NOTE — Telephone Encounter (Signed)
Call Dr. Vickey Hugerohmeier who performed the sleep study and they would be happy to help.

## 2014-07-30 NOTE — Telephone Encounter (Signed)
Dr. Vickey Hugerohmeier,  Will you be ordering the CPAP for [name] or does Dr. Patsy Lageropland need to do this?  Please advise.  Terese Dooronna Javad Salva ,CMA

## 2014-09-14 ENCOUNTER — Encounter: Payer: Self-pay | Admitting: Neurology

## 2014-09-30 ENCOUNTER — Other Ambulatory Visit: Payer: Self-pay | Admitting: Family Medicine

## 2014-10-01 ENCOUNTER — Encounter: Payer: Self-pay | Admitting: Primary Care

## 2014-10-01 ENCOUNTER — Ambulatory Visit (INDEPENDENT_AMBULATORY_CARE_PROVIDER_SITE_OTHER): Admitting: Primary Care

## 2014-10-01 VITALS — BP 126/80 | HR 87 | Temp 98.0°F | Ht 65.5 in | Wt 279.8 lb

## 2014-10-01 DIAGNOSIS — F411 Generalized anxiety disorder: Secondary | ICD-10-CM | POA: Diagnosis not present

## 2014-10-01 MED ORDER — ESCITALOPRAM OXALATE 10 MG PO TABS: ORAL_TABLET | ORAL | Status: DC

## 2014-10-01 NOTE — Progress Notes (Signed)
Subjective:    Patient ID: Kevin [name], male    DOB: 03/26/1961, 54 y.o.   MRN: [id number]  HPI  Kevin [name] is a 54 year old male who presents today with a chief complaint of anxiety for the past 6+ months. Over this past week he been experiencing an increase in body aches ,muscle tension, anxiety, and blood pressure. His highest BP over the weekend was 139/89. Last night was 118/72.   BP Readings from Last 3 Encounters:  10/01/14 126/80  07/03/14 125/81  06/07/14 123/79     He reports daily worry and stress with family and work; difficulty in controlling his worry; some irritability; moderate amount of muscle tension; restlessness; and difficulty maintaining focus. Denies difficulty sleeping at night, panic attacks, feeling depressed or having little interest in doing activities. He does report some impact with his worry in his daily life.  He will typically manage his stress by doing hobbies,, but lately has felt more stressed and is unable to calm himself as easily.  His wife is a Engineer, civil (consulting)nurse and he is able to talk with her at home regarding his stress.   Review of Systems  Respiratory: Negative for shortness of breath.   Cardiovascular: Negative for chest pain.  Musculoskeletal:       Muscle tension/tightness  Neurological: Negative for dizziness and headaches.  Psychiatric/Behavioral: Positive for decreased concentration. Negative for suicidal ideas and sleep disturbance. The patient is nervous/anxious.        Past Medical History  Diagnosis Date  . Hyperlipidemia   . Hypertension   . Obesity     History   Social History  . Marital Status: Married    Spouse Name: [name]  . Number of Children: N/A  . 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 Smoking status: Former Smoker -- 1.00 packs/day for 15 years    Types: Cigarettes    Quit date: 05/25/2014  . Smokeless tobacco: Never Used  . Alcohol Use: No  . Drug  Use: No  . Sexual Activity: Not on file   Other Topics Concern  . Not on file   Social History Narrative   Former [employer] '86-2009, E7   Caffeine 4-5 cups daily avg.   Married no kids.     No past surgical history on file.  Family History  Problem Relation Age of Onset  . Heart attack Mother   . Coronary artery disease Mother   . COPD Father   . Diabetes Father     No Known Allergies  Current Outpatient Prescriptions on File Prior to Visit  Medication Sig Dispense Refill  . aspirin 81 MG tablet Take 81 mg by mouth daily.      Marland Kitchen. lisinopril (PRINIVIL,ZESTRIL) 20 MG tablet TAKE 1 TABLET BY MOUTH EVERY DAY 90 tablet 3  . Multiple Vitamins-Minerals (MULTIVITAMIN WITH MINERALS) tablet Take 1 tablet by mouth daily.    . simvastatin (ZOCOR) 40 MG tablet TAKE 1 TABLET (40 MG TOTAL) BY MOUTH DAILY. **NEEDS OFFICE VISIT** 90 tablet 0  . albuterol (PROVENTIL HFA;VENTOLIN HFA) 108 (90 BASE) MCG/ACT inhaler Inhale 2 puffs into the lungs every 6 (six) hours as needed for wheezing or shortness of breath. (Patient not taking: Reported on 10/01/2014) 1 Inhaler 3   No current facility-administered medications on file prior to visit.    BP 126/80 mmHg  Pulse 87  Temp(Src) 98 F (36.7 C) (Oral)  Ht 5'  5.5" (1.664 m)  Wt 279 lb 12.8 oz (126.916 kg)  BMI 45.84 kg/m2  SpO2 96%    Objective:   Physical Exam  Constitutional: He is oriented to person, place, and time.  Cardiovascular: Normal rate and regular rhythm.   Pulmonary/Chest: Effort normal and breath sounds normal.  Neurological: He is alert and oriented to person, place, and time.  Skin: Skin is warm and dry.  Psychiatric: He has a normal mood and affect.          Assessment & Plan:

## 2014-10-01 NOTE — Assessment & Plan Note (Signed)
Meets criteria. Denies symptoms of depression; SI/HI. Start Lexapro 10 mg tablets. 1/2 tablet by mouth daily for 6 days, then 1 full tablet thereafter. I've explained to him that drugs of the SSRI class can have side effects such as weight gain, sexual dysfunction, insomnia, headache, nausea. These medications are generally effective at alleviating symptoms of anxiety and/or depression. Also explained that these medications may cause suicidal ideation and to present to the emergency department immediately if these thoughts are present. He verbalized understanding. Follow up in 4 weeks for re-evaluation. Declines therapy at this time.

## 2014-10-01 NOTE — Telephone Encounter (Signed)
Please call and schedule CPE with fasting labs prior with Dr. Copland.  

## 2014-10-01 NOTE — Patient Instructions (Signed)
Start taking Lexapro for anxiety. Take 1/2 tablet by mouth daily for 6 days, then advance to 1 full tablet thereafter. Follow up with either Dr. Patsy Lageropland or myself in 4 weeks for re-evaluation. It was nice meeting you!  Generalized Anxiety Disorder Generalized anxiety disorder (GAD) is a mental disorder. It interferes with life functions, including relationships, work, and school. GAD is different from normal anxiety, which everyone experiences at some point in their lives in response to specific life events and activities. Normal anxiety actually helps us prepare for and get through these life events and activities. Normal anxiety goes away after the event or activity is over.  GAD causes anxiety that is not necessarily related to specific events or activities. It also causes excess anxiety in proportion to specific events or activities. The anxiety associated with GAD is also difficult to control. GAD can vary from mild to severe. People with severe GAD can have intense waves of anxiety with physical symptoms (panic attacks).  SYMPTOMS The anxiety and worry associated with GAD are difficult to control. This anxiety and worry are related to many life events and activities and also occur more days than not for 6 months or longer. People with GAD also have three or more of the following symptoms (one or more in children):  Restlessness.   Fatigue.  Difficulty concentrating.   Irritability.  Muscle tension.  Difficulty sleeping or unsatisfying sleep. DIAGNOSIS GAD is diagnosed through an assessment by your health care provider. Your health care provider will ask you questions aboutyour mood,physical symptoms, and events in your life. Your health care provider may ask you about your medical history and use of alcohol or drugs, including prescription medicines. Your health care provider may also do a physical exam and blood tests. Certain medical conditions and the use of certain substances can  cause symptoms similar to those associated with GAD. Your health care provider may refer you to a mental health specialist for further evaluation. TREATMENT The following therapies are usually used to treat GAD:   Medication. Antidepressant medication usually is prescribed for long-term daily control. Antianxiety medicines may be added in severe cases, especially when panic attacks occur.   Talk therapy (psychotherapy). Certain types of talk therapy can be helpful in treating GAD by providing support, education, and guidance. A form of talk therapy called cognitive behavioral therapy can teach you healthy ways to think about and react to daily life events and activities.  Stress managementtechniques. These include yoga, meditation, and exercise and can be very helpful when they are practiced regularly. A mental health specialist can help determine which treatment is best for you. Some people see improvement with one therapy. However, other people require a combination of therapies. Document Released: 09/05/2012 Document Revised: 09/25/2013 Document Reviewed: 09/05/2012 [facility] Patient Information 2015 [place]. This information is not intended to replace advice given to you by your health care provider. Make sure you discuss any questions you have with your health care provider.

## 2014-10-01 NOTE — Progress Notes (Signed)
Pre visit review using our clinic review tool, if applicable. No additional management support is needed unless otherwise documented below in the visit note. 

## 2014-10-29 ENCOUNTER — Ambulatory Visit: Admitting: Family Medicine

## 2014-11-02 ENCOUNTER — Ambulatory Visit: Admitting: Family Medicine

## 2014-11-02 ENCOUNTER — Telehealth: Payer: Self-pay | Admitting: Family Medicine

## 2014-11-02 DIAGNOSIS — Z0289 Encounter for other administrative examinations: Secondary | ICD-10-CM

## 2014-11-02 NOTE — Telephone Encounter (Signed)
Patient did not come in for their appointment today for 4 week follow up per Jae Dire.  Please let me know if patient needs to be contacted immediately for follow up or no follow up needed.

## 2014-12-02 ENCOUNTER — Other Ambulatory Visit: Payer: Self-pay | Admitting: Family Medicine

## 2014-12-02 NOTE — Telephone Encounter (Signed)
Please call and schedule CPE with fasting labs prior with Dr. Patsy Lageropland.  He will need this appointment in order to keep getting refills on his medications.

## 2014-12-20 ENCOUNTER — Telehealth: Payer: Self-pay | Admitting: *Deleted

## 2014-12-20 MED ORDER — SIMVASTATIN 40 MG PO TABS: ORAL_TABLET | ORAL | Status: DC

## 2014-12-20 MED ORDER — LISINOPRIL 20 MG PO TABS: 20.0000 mg | ORAL_TABLET | Freq: Every day | ORAL | Status: DC

## 2014-12-20 NOTE — Telephone Encounter (Signed)
Scheduled for labs 8/22 and cpe 8/24. Wife aware

## 2014-12-20 NOTE — Telephone Encounter (Signed)
Please call and schedule CPE with fasting labs prior with Dr. Patsy Lager.  Needs appointment to keep getting refills on his medications.

## 2015-01-14 ENCOUNTER — Other Ambulatory Visit (INDEPENDENT_AMBULATORY_CARE_PROVIDER_SITE_OTHER)

## 2015-01-14 ENCOUNTER — Other Ambulatory Visit: Payer: Self-pay | Admitting: Primary Care

## 2015-01-14 DIAGNOSIS — E785 Hyperlipidemia, unspecified: Secondary | ICD-10-CM | POA: Diagnosis not present

## 2015-01-14 DIAGNOSIS — Z125 Encounter for screening for malignant neoplasm of prostate: Secondary | ICD-10-CM

## 2015-01-14 DIAGNOSIS — Z79899 Other long term (current) drug therapy: Secondary | ICD-10-CM

## 2015-01-14 LAB — HEPATIC FUNCTION PANEL
ALBUMIN: 4.1 g/dL (ref 3.5–5.2)
ALK PHOS: 71 U/L (ref 39–117)
ALT: 33 U/L (ref 0–53)
AST: 20 U/L (ref 0–37)
BILIRUBIN DIRECT: 0.1 mg/dL (ref 0.0–0.3)
BILIRUBIN TOTAL: 0.6 mg/dL (ref 0.2–1.2)
Total Protein: 6.9 g/dL (ref 6.0–8.3)

## 2015-01-14 LAB — CBC WITH DIFFERENTIAL/PLATELET
BASOS ABS: 0 10*3/uL (ref 0.0–0.1)
Basophils Relative: 0.3 % (ref 0.0–3.0)
EOS ABS: 0.4 10*3/uL (ref 0.0–0.7)
Eosinophils Relative: 3.8 % (ref 0.0–5.0)
HCT: 46.4 % (ref 39.0–52.0)
Hemoglobin: 15.7 g/dL (ref 13.0–17.0)
LYMPHS ABS: 2.8 10*3/uL (ref 0.7–4.0)
Lymphocytes Relative: 27.5 % (ref 12.0–46.0)
MCHC: 33.8 g/dL (ref 30.0–36.0)
MCV: 90.6 fl (ref 78.0–100.0)
MONO ABS: 0.8 10*3/uL (ref 0.1–1.0)
MONOS PCT: 7.7 % (ref 3.0–12.0)
NEUTROS ABS: 6.2 10*3/uL (ref 1.4–7.7)
NEUTROS PCT: 60.7 % (ref 43.0–77.0)
PLATELETS: 229 10*3/uL (ref 150.0–400.0)
RBC: 5.12 Mil/uL (ref 4.22–5.81)
RDW: 14 % (ref 11.5–15.5)
WBC: 10.1 10*3/uL (ref 4.0–10.5)

## 2015-01-14 LAB — BASIC METABOLIC PANEL
BUN: 15 mg/dL (ref 6–23)
CALCIUM: 9.5 mg/dL (ref 8.4–10.5)
CHLORIDE: 106 meq/L (ref 96–112)
CO2: 27 meq/L (ref 19–32)
CREATININE: 0.86 mg/dL (ref 0.40–1.50)
GFR: 98.83 mL/min (ref >60.00)
GLUCOSE: 69 mg/dL — AB (ref 70–99)
Potassium: 4.4 mEq/L (ref 3.5–5.1)
Sodium: 141 mEq/L (ref 135–145)

## 2015-01-14 LAB — LIPID PANEL
CHOL/HDL RATIO: 5
Cholesterol: 147 mg/dL (ref 0–200)
HDL: 29.3 mg/dL — AB (ref >39.00)
LDL CALC: 92 mg/dL (ref 0–99)
NONHDL: 117.36
TRIGLYCERIDES: 127 mg/dL (ref 0.0–149.0)
VLDL: 25.4 mg/dL (ref 0.0–40.0)

## 2015-01-14 LAB — PSA: PSA: 0.44 ng/mL (ref 0.10–4.00)

## 2015-01-14 NOTE — Telephone Encounter (Signed)
Electronically refill request  Dr Cyndie Chime patient  Was seen by Jae Dire on 10/01/2014 and was prescribed   escitalopram (LEXAPRO) 10 MG tablet     Dispense: 30 tablet   Refills: 2    Patient had cancel follow up on 10/29/14 and no show on 11/02/14 with Dr Patsy Lager.  Next appointment CPE is schedule on 01/16/15

## 2015-01-16 ENCOUNTER — Encounter: Payer: Self-pay | Admitting: Family Medicine

## 2015-01-16 ENCOUNTER — Ambulatory Visit (INDEPENDENT_AMBULATORY_CARE_PROVIDER_SITE_OTHER): Admitting: Family Medicine

## 2015-01-16 VITALS — BP 126/80 | HR 75 | Temp 98.5°F | Ht 64.75 in | Wt 264.5 lb

## 2015-01-16 DIAGNOSIS — Z0000 Encounter for general adult medical examination without abnormal findings: Secondary | ICD-10-CM | POA: Diagnosis not present

## 2015-01-16 DIAGNOSIS — Z23 Encounter for immunization: Secondary | ICD-10-CM

## 2015-01-16 MED ORDER — LISINOPRIL 20 MG PO TABS: 20.0000 mg | ORAL_TABLET | Freq: Every day | ORAL | Status: DC

## 2015-01-16 MED ORDER — SIMVASTATIN 40 MG PO TABS: ORAL_TABLET | ORAL | Status: DC

## 2015-01-16 MED ORDER — ESCITALOPRAM OXALATE 10 MG PO TABS: ORAL_TABLET | ORAL | Status: DC

## 2015-01-16 NOTE — Progress Notes (Signed)
Pre visit review using our clinic review tool, if applicable. No additional management support is needed unless otherwise documented below in the visit note. 
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
## 2015-01-31 ENCOUNTER — Other Ambulatory Visit: Payer: Self-pay | Admitting: Family Medicine

## 2015-03-18 ENCOUNTER — Ambulatory Visit (INDEPENDENT_AMBULATORY_CARE_PROVIDER_SITE_OTHER)
Admission: RE | Admit: 2015-03-18 | Discharge: 2015-03-18 | Disposition: A | Discharge status: Home / Self Care | Source: Ambulatory Visit | Attending: Family Medicine | Admitting: Family Medicine

## 2015-03-18 ENCOUNTER — Ambulatory Visit (INDEPENDENT_AMBULATORY_CARE_PROVIDER_SITE_OTHER): Admitting: Family Medicine

## 2015-03-18 ENCOUNTER — Telehealth: Payer: Self-pay | Admitting: Family Medicine

## 2015-03-18 ENCOUNTER — Encounter: Payer: Self-pay | Admitting: Family Medicine

## 2015-03-18 VITALS — BP 124/76 | HR 96 | Temp 97.8°F | Ht 64.75 in | Wt 270.0 lb

## 2015-03-18 DIAGNOSIS — M25561 Pain in right knee: Secondary | ICD-10-CM

## 2015-03-18 MED ORDER — TRAMADOL HCL 50 MG PO TABS: 50.0000 mg | ORAL_TABLET | Freq: Four times a day (QID) | ORAL | Status: DC | PRN

## 2015-03-18 NOTE — Patient Instructions (Signed)

## 2015-03-18 NOTE — Telephone Encounter (Signed)
Tramadol called [address] [facility] [address] [place].  Kevin [name] notified as instructed by telephone.

## 2015-03-18 NOTE — Progress Notes (Signed)
Pre visit review using our clinic review tool, if applicable. No additional management support is needed unless otherwise documented below in the visit note. 

## 2015-03-18 NOTE — Telephone Encounter (Signed)
Alleve 2 tabs by mouth two times a day over the counter: Take at least for 2 - 3 weeks. This is equal to a prescripton strength dose (GENERIC CHEAPER EQUIVALENT IS NAPROXEN SODIUM)   Take Tylenol/Acetaminophen ES (500mg ) 2 tabs by mouth three times a day max as needed.   CALL in  Tramadol 50 mg, 1 po qid prn pain. #50, 0 ref  He can take all 3 meds at the same time.  Rec: OTC hinged knee brace from pharmacy

## 2015-03-18 NOTE — Telephone Encounter (Signed)
Spouse called stating pt didn't tell you that needs something for pain.  All he is taking is tylenol or aleve and this is not helping  Also they live in an 3rd floor apartment.  He needs a brace or something to help go up stairs until his mri on 11/3 [facility] [address] [place]

## 2015-03-19 NOTE — Progress Notes (Signed)
Dr. Karleen HampshireSpencer T. Nikky Duba, MD, CAQ Sports Medicine [facility] Sports Medicine [address] [place], [id number] Phone: [phone] Fax: [phone]  03/18/2015  Patient: Kevin [name], MRN: [id number], DOB: 03/15/1961, 54 y.o.  Primary Physician:  Hannah BeatSpencer Ataya Murdy, MD   Chief Complaint  Patient presents with  . Knee Pain    Right   Subjective:   Kevin [name] is a 54 y.o. very pleasant male patient who presents with the following:  Patient presents with 2 mo h/o R knee sided knee pain after no clear injury. He has had some intermittent knee pain off and on for a year or 2, but this is very different, now he has mechanical symptoms.. No audible pop was heard. The patient has not had an effusion. No symptomatic giving-way. No mechanical clicking. Joint has locked up. Patient has been able to walk but is limping.  Flexion is dramatically worse compared to the opposite leg.  He is having a mechanical click and sticking feeling in his knee sometimes when he is flexing it.  The patient does not have pain going up and down stairs or rising from a seated position.   Pain location: all over and medial Current physical activity: work only Prior Knee Surgery: none Current pain meds: Tylenol, NSAIDS Bracing: none Occupation or school level: [employer]  Past Medical History, Surgical History, Social History, Family History, Problem List, Medications, and Allergies have been reviewed and updated if relevant.  Patient Active Problem List   Diagnosis Date Noted  . Generalized anxiety disorder 10/01/2014  . 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 Conjunctivitis 01/30/2014  . Severe obesity (BMI >= 40) (HCC) 08/31/2013  . SLEEP APNEA 06/16/2010  . HYPERCHOLESTEROLEMIA 12/31/2008  . 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 HYPERTENSION 12/31/2008    Past Medical History  Diagnosis Date  . Hyperlipidemia   . Hypertension   . Obesity     No past surgical history on  file.  Social History   Social History  . Marital Status: Married    Spouse Name: [name]  . Number of Children: N/A  . Years of Education: N/A   Occupational History  . 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  [employer]  .  [employer]    (bought out [employer])   Social History Main Topics  . Smoking status: Former Smoker -- 1.00 packs/day for 15 years    Types: Cigarettes    Quit date: 05/25/2014  . Smokeless tobacco: Never Used  . Alcohol Use: No  . Drug Use: No  . Sexual Activity: Not on file   Other Topics Concern  . Not on file   Social History Narrative   Former [employer]-2009, [employer]7   Caffeine 4-5 cups daily avg.   Married no kids.    [employer], now [employer]    Family History  Problem Relation Age of Onset  . Heart attack Mother   . Coronary artery disease Mother   . COPD Father   . Diabetes Father     No Known Allergies  Medication list reviewed and updated in full in [facility].  GEN: No fevers, chills. Nontoxic. Primarily MSK c/o today. MSK: Detailed in the HPI GI: tolerating PO intake without difficulty Neuro: No numbness, parasthesias, or tingling associated. Otherwise the pertinent positives of the ROS are noted above.   Objective:   BP 124/76 mmHg  Pulse 96  Temp(Src) 97.8 F (36.6 C) (Oral)  Ht 5' 4.75" (1.645 m)  Wt 270  lb (122.471 kg)  BMI 45.26 kg/m2   GEN: WDWN, NAD, Non-toxic, Alert & Oriented x 3 HEENT: Atraumatic, Normocephalic.  Ears and Nose: No external deformity. EXTR: No clubbing/cyanosis/edema NEURO: Normal gait, antalgia PSYCH: Normally interactive. Conversant. Not depressed or anxious appearing.  Calm demeanor.   Knee:  R Gait: Normal heel toe pattern ROM: lacks 4 deg of extension and flexion to 95 degrees Effusion: mild Echymosis or edema: none Patellar tendon NT Painful PLICA: neg Patellar grind: negative Medial and lateral patellar facet loading: negative medial and lateral joint lines: mild pain Mcmurray's pain Flexion-pinch pos A FAIRLY DRAMATIC  MECHANICAL POP IS FELT WITH KNEE MOVEMENT Varus and valgus stress: stable Lachman: neg Ant and Post drawer: neg Hip abduction, IR, ER: WNL Hip flexion str: 5/5 Hip abd: 5/5 Quad: 5/5 VMO atrophy:No Hamstring concentric and eccentric: 5/5   Radiology: Dg Knee Ap/lat W/sunrise Right  03/18/2015  CLINICAL DATA:  Chronic right knee pain. EXAM: RIGHT KNEE 3 VIEWS COMPARISON:  None. FINDINGS: No fracture.  No bone lesion. There is narrowing of the medial and patellofemoral joint space compartments. Small marginal osteophytes are noted from all 3 compartments is well is from the tibial spine. No convincing joint effusion.  Soft tissues are unremarkable. IMPRESSION: 1. No fracture or acute finding. 2. Moderate osteoarthritis. Electronically Signed   By: Amie Portland M.D.   On: 03/18/2015 14:00    Assessment and Plan:   Right knee pain - Plan: DG Knee AP/LAT W/Sunrise Right, MR Knee Right Wo Contrast  New onset right knee pain with mechanical symptoms that are quite profound.  Failure of conservative management 2 months with rest, icing, compression, multiple anti-inflammatories and Tylenol.  Obtain an MRI of the right knee to evaluate the patient's mechanical symptoms with concern for potential large meniscal tear, loose body, ganglion cyst, or other internal derangement that is causing mechanical symptoms.  Follow-up: depends on MRI  New Prescriptions   TRAMADOL (ULTRAM) 50 MG TABLET    Take 1 tablet (50 mg total) by mouth 4 (four) times daily as needed.   Modified Medications   No medications on file   Orders Placed This Encounter  Procedures  . DG Knee AP/LAT W/Sunrise Right  . MR Knee Right Wo Contrast    Signed,  Raife Lizer T. Latana Colin, MD   Patient's Medications  New Prescriptions   TRAMADOL (ULTRAM) 50 MG TABLET    Take 1 tablet (50 mg total) by mouth 4 (four) times daily as needed.  Previous Medications   ASPIRIN 81 MG TABLET    Take 81 mg by mouth daily.     ESCITALOPRAM  (LEXAPRO) 10 MG TABLET    1 po daily   LISINOPRIL (PRINIVIL,ZESTRIL) 20 MG TABLET    Take 1 tablet (20 mg total) by mouth daily.   MULTIPLE VITAMINS-MINERALS (MULTIVITAMIN WITH MINERALS) TABLET    Take 1 tablet by mouth daily.   SIMVASTATIN (ZOCOR) 40 MG TABLET    TAKE 1 TABLET DAILY (NEED OFFICE VISIT)  Modified Medications   No medications on file  Discontinued Medications   No medications on file

## 2015-03-28 ENCOUNTER — Ambulatory Visit
Admission: RE | Admit: 2015-03-28 | Discharge: 2015-03-28 | Disposition: A | Discharge status: Home / Self Care | Source: Ambulatory Visit | Attending: Family Medicine | Admitting: Family Medicine

## 2015-03-28 ENCOUNTER — Telehealth: Payer: Self-pay | Admitting: *Deleted

## 2015-03-28 DIAGNOSIS — S83281A Other tear of lateral meniscus, current injury, right knee, initial encounter: Secondary | ICD-10-CM | POA: Diagnosis not present

## 2015-03-28 DIAGNOSIS — M25561 Pain in right knee: Secondary | ICD-10-CM | POA: Diagnosis present

## 2015-03-28 DIAGNOSIS — X58XXXA Exposure to other specified factors, initial encounter: Secondary | ICD-10-CM | POA: Diagnosis not present

## 2015-03-28 NOTE — Telephone Encounter (Signed)
-----   Message from Hannah BeatSpencer Copland, MD sent at 03/28/2015  3:34 PM EDT ----- Please call  MRI of knee has multiple abnormalities.   1. Largest is probably a fairly large 1.5 cm osteochondral defect with bone edema throughout at the very end of the femur 2. Meniscal tears on the medial and lateral sides of the knee.   Fairly complex injury - I would recommend Orthopedic surgical consultation for definitive plans.

## 2015-03-28 NOTE — Telephone Encounter (Signed)
Left message for [name] to return my call.

## 2015-03-29 NOTE — Telephone Encounter (Signed)
Debbie notified as instructed by telephone.  They are agreeable to the Orthopedic Referral.

## 2015-03-29 NOTE — Telephone Encounter (Signed)
Patient's wife,Debbie,returned Donna's call.  Please call her back at [phone].

## 2015-03-31 ENCOUNTER — Other Ambulatory Visit: Payer: Self-pay | Admitting: Family Medicine

## 2015-03-31 DIAGNOSIS — M958 Other specified acquired deformities of musculoskeletal system: Secondary | ICD-10-CM

## 2015-03-31 DIAGNOSIS — S83241A Other tear of medial meniscus, current injury, right knee, initial encounter: Secondary | ICD-10-CM

## 2015-03-31 DIAGNOSIS — S838X1A Sprain of other specified parts of right knee, initial encounter: Secondary | ICD-10-CM

## 2015-04-11 ENCOUNTER — Other Ambulatory Visit: Payer: Self-pay | Admitting: Family Medicine

## 2015-08-07 ENCOUNTER — Encounter: Payer: Self-pay | Admitting: Family Medicine

## 2015-08-07 ENCOUNTER — Ambulatory Visit (INDEPENDENT_AMBULATORY_CARE_PROVIDER_SITE_OTHER): Payer: BLUE CROSS/BLUE SHIELD | Admitting: Family Medicine

## 2015-08-07 VITALS — BP 139/75 | HR 70 | Temp 97.7°F | Ht 64.75 in | Wt 277.0 lb

## 2015-08-07 DIAGNOSIS — Z713 Dietary counseling and surveillance: Secondary | ICD-10-CM | POA: Diagnosis not present

## 2015-08-07 DIAGNOSIS — M545 Low back pain, unspecified: Secondary | ICD-10-CM

## 2015-08-07 MED ORDER — ESCITALOPRAM OXALATE 10 MG PO TABS: 10.0000 mg | ORAL_TABLET | Freq: Every day | ORAL | Status: DC

## 2015-08-07 MED ORDER — PREDNISONE 20 MG PO TABS: ORAL_TABLET | ORAL | Status: DC

## 2015-08-07 MED ORDER — TRAMADOL HCL 50 MG PO TABS: 50.0000 mg | ORAL_TABLET | Freq: Four times a day (QID) | ORAL | Status: DC | PRN

## 2015-08-07 NOTE — Patient Instructions (Addendum)
Stop meloxicam while you are taking prednisone   [facility] Low Glycemic Diet (Source: [facility], 2006)  Low Glycemic Foods (20-49) (Decrease risk of developing heart disease)  Best for Diabetes: Eat Mostly these  Breakfast Cereals: All-Bran All-Bran Fruit 'n Oats Fiber One Oatmeal (not instant) Oat bran  Fruits and fruit juices: (Limit to 1-2 servings per day) Apples Apricots (fresh & dried) Blackberries Blueberries Cherries Cranberries Peaches Pears Plums Prunes Grapefruit Raspberries Strawberries Tangerine  Juices: Apple juice Grapefruit juice Tomato juice  Beans and legumes (fresh-cooked): Black-eyed peas Butter beans Chick peas Lentils  Green beans Lima beans Kidney beans Navy beans Pinto beans Snow peas  Non-starchy vegetables: Asparagus, avocado, broccoli, cabbage, cauliflower, celery, cucumber, greens, lettuce, mushrooms, peppers, tomatoes, okra, onions, spinach, summer squash  Grains: Barley Bulgur Rye Wild rice  Nuts and oils : Almonds Peanuts Sunflower seeds Hazelnuts Pecans Walnuts Oils that are liquid at room temperature  Dairy, fish, meat, soy, and eggs: Milk, skim Lowfat cheese Yogurt, lowfat, fruit sugar sweetened Lean red meat Fish  Skinless chicken & Malaw[place]urkey Shellfish Egg whites (up to 3 daily) Soy products  Egg yolks (up to 7 or _____ per week) Moderate Glycemic Foods (50-69)  OK sometimes with diabetes  Breakfast Cereals: Bran Buds Bran Chex Just Right Mini-Wheats  Special K Swiss muesli  Fruits: Banana (under-ripe) Dates Figs Grapes Kiwi Mango Oranges Raisins  Fruit Juices: Cranberry juice Orange juice  Beans and legumes: [place]-type baked beans Canned pinto, kidney, or navy beans Green peas  Vegetables: Beets Carrots  Sweet potato Yam Corn on the cob  Breads: Pita (pocket) bread Oat bran bread Pumpernickel bread Rye bread Wheat bread, high fiber   Grains: Cornmeal Rice, brown Rice,  white Couscous  Pasta: Macaroni Pizza, cheese Ravioli, meat filled Spaghetti, white   Nuts: Cashews Macadamia  Snacks: Chocolate Ice cream, lowfat Muffin Popcorn High Glycemic Foods (70-100)  Rare: Eat occaisionally with diabetes  THESE ARE THE WORST KIND OF FOODS FOR YOUR DIABETES  Breakfast Cereals: Cheerios Corn Chex Corn Flakes Cream of Wheat Grape Nuts Grape Nut Flakes Grits Nutri-Grain Puffed Rice Puffed Wheat Rice Chex Rice Krispies Shredded Wheat Team Total  Fruits: Pineapple Watermelon Banana (over-ripe) Beverages: Sodas, sweet tea, pineapple juice  Vegetables: Potato, baked, boiled, fried, mashed JamaicaFrench fries Canned or frozen corn Parsnips Winter squash  Breads: Most breads (white and whole grain) Bagels Bread sticks Bread stuffing Kaiser roll Dinner rolls  Grains: Rice, instant Tapioca, with milk Candy and most cookies  Snacks: Donuts Corn chips Jelly beans Pretzels Pastries      Calorie Counting for Weight Loss Calories are energy you get from the things you eat and drink. Your body uses this energy to keep you going throughout the day. The number of calories you eat affects your weight. When you eat more calories than your body needs, your body stores the extra calories as fat. When you eat fewer calories than your body needs, your body burns fat to get the energy it needs. Calorie counting means keeping track of how many calories you eat and drink each day. If you make sure to eat fewer calories than your body needs, you should lose weight. In order for calorie counting to work, you will need to eat the number of calories that are right for you in a day to lose a healthy amount of weight per week. A healthy amount of weight to lose per week is usually 1-2 lb (0.5-0.9 kg). A dietitian can  determine how many calories you need in a day and give you suggestions on how to reach your calorie goal.  WHAT IS MY MY PLAN? My goal is to have __________  calories per day.  If I have this many calories per day, I should lose around __________ pounds per week. WHAT DO I NEED TO KNOW ABOUT CALORIE COUNTING? In order to meet your daily calorie goal, you will need to:  Find out how many calories are in each food you would like to eat. Try to do this before you eat.  Decide how much of the food you can eat.  Write down what you ate and how many calories it had. Doing this is called keeping a food log. WHERE DO I FIND CALORIE INFORMATION? The number of calories in a food can be found on a Nutrition Facts label. Note that all the information on a label is based on a specific serving of the food. If a food does not have a Nutrition Facts label, try to look up the calories online or ask your dietitian for help. HOW DO I DECIDE HOW MUCH TO EAT? To decide how much of the food you can eat, you will need to consider both the number of calories in one serving and the size of one serving. This information can be found on the Nutrition Facts label. If a food does not have a Nutrition Facts label, look up the information online or ask your dietitian for help. Remember that calories are listed per serving. If you choose to have more than one serving of a food, you will have to multiply the calories per serving by the amount of servings you plan to eat. For example, the label on a package of bread might say that a serving size is 1 slice and that there are 90 calories in a serving. If you eat 1 slice, you will have eaten 90 calories. If you eat 2 slices, you will have eaten 180 calories. HOW DO I KEEP A FOOD LOG? After each meal, record the following information in your food log:  What you ate.  How much of it you ate.  How many calories it had.  Then, add up your calories. Keep your food log near you, such as in a small notebook in your pocket. Another option is to use a mobile app or website. Some programs will calculate calories for you and show you how many  calories you have left each time you add an item to the log. WHAT ARE SOME CALORIE COUNTING TIPS?  Use your calories on foods and drinks that will fill you up and not leave you hungry. Some examples of this include foods like nuts and nut butters, vegetables, lean proteins, and high-fiber foods (more than 5 g fiber per serving).  Eat nutritious foods and avoid empty calories. Empty calories are calories you get from foods or beverages that do not have many nutrients, such as candy and soda. It is better to have a nutritious high-calorie food (such as an avocado) than a food with few nutrients (such as a bag of chips).  Know how many calories are in the foods you eat most often. This way, you do not have to look up how many calories they have each time you eat them.  Look out for foods that may seem like low-calorie foods but are really high-calorie foods, such as baked goods, soda, and fat-free candy.  Pay attention to calories in drinks. Drinks such  as sodas, specialty coffee drinks, alcohol, and juices have a lot of calories yet do not fill you up. Choose low-calorie drinks like water and diet drinks.  Focus your calorie counting efforts on higher calorie items. Logging the calories in a garden salad that contains only vegetables is less important than calculating the calories in a milk shake.  Find a way of tracking calories that works for you. Get creative. Most people who are successful find ways to keep track of how much they eat in a day, even if they do not count every calorie. WHAT ARE SOME PORTION CONTROL TIPS?  Know how many calories are in a serving. This will help you know how many servings of a certain food you can have.  Use a measuring cup to measure serving sizes. This is helpful when you start out. With time, you will be able to estimate serving sizes for some foods.  Take some time to put servings of different foods on your favorite plates, bowls, and cups so you know what a  serving looks like.  Try not to eat straight from a bag or box. Doing this can lead to overeating. Put the amount you would like to eat in a cup or on a plate to make sure you are eating the right portion.  Use smaller plates, glasses, and bowls to prevent overeating. This is a quick and easy way to practice portion control. If your plate is smaller, less food can fit on it.  Try not to multitask while eating, such as watching TV or using your computer. If it is time to eat, sit down at a table and enjoy your food. Doing this will help you to start recognizing when you are full. It will also make you more aware of what and how much you are eating. HOW CAN I CALORIE COUNT WHEN EATING OUT?  Ask for smaller portion sizes or child-sized portions.  Consider sharing an entree and sides instead of getting your own entree.  If you get your own entree, eat only half. Ask for a box at the beginning of your meal and put the rest of your entree in it so you are not tempted to eat it.  Look for the calories on the menu. If calories are listed, choose the lower calorie options.  Choose dishes that include vegetables, fruits, whole grains, low-fat dairy products, and lean protein. Focusing on smart food choices from each of the 5 food groups can help you stay on track at restaurants.  Choose items that are boiled, broiled, grilled, or steamed.  Choose water, milk, unsweetened iced tea, or other drinks without added sugars. If you want an alcoholic beverage, choose a lower calorie option. For example, a regular margarita can have up to 700 calories and a glass of wine has around 150.  Stay away from items that are buttered, battered, fried, or served with cream sauce. Items labeled "crispy" are usually fried, unless stated otherwise.  Ask for dressings, sauces, and syrups on the side. These are usually very high in calories, so do not eat much of them.  Watch out for salads. Many people think salads are a  healthy option, but this is often not the case. Many salads come with bacon, fried chicken, lots of cheese, fried chips, and dressing. All of these items have a lot of calories. If you want a salad, choose a garden salad and ask for grilled meats or steak. Ask for the dressing on the side,  or ask for olive oil and vinegar or lemon to use as dressing.  Estimate how many servings of a food you are given. For example, a serving of cooked rice is  cup or about the size of half a tennis ball or one cupcake wrapper. Knowing serving sizes will help you be aware of how much food you are eating at restaurants. The list below tells you how big or small some common portion sizes are based on everyday objects.  1 oz--4 stacked dice.  3 oz--1 deck of cards. 1 tsp--1 dice.   Exercising to Lose Weight Exercising can help you to lose weight. In order to lose weight through exercise, you need to do vigorous-intensity exercise. You can tell that you are exercising with vigorous intensity if you are breathing very hard and fast and cannot hold a conversation while exercising. Moderate-intensity exercise helps to maintain your current weight. You can tell that you are exercising at a moderate level if you have a higher heart rate and faster breathing, but you are still able to hold a conversation. HOW OFTEN SHOULD I EXERCISE? Choose an activity that you enjoy and set realistic goals. Your health care provider can help you to make an activity plan that works for you. Exercise regularly as directed by your health care provider. This may include:  Doing resistance training twice each week, such as:  Push-ups.  Sit-ups.  Lifting weights.  Using resistance bands.  Doing a given intensity of exercise for a given amount of time. Choose from these options:  150 minutes of moderate-intensity exercise every week.  75 minutes of vigorous-intensity exercise every week.  A mix of moderate-intensity and  vigorous-intensity exercise every week. Children, pregnant women, people who are out of shape, people who are overweight, and older adults may need to consult a health care provider for individual recommendations. If you have any sort of medical condition, be sure to consult your health care provider before starting a new exercise program. WHAT ARE SOME ACTIVITIES THAT CAN HELP ME TO LOSE WEIGHT?   Walking at a rate of at least 4.5 miles an hour.  Jogging or running at a rate of 5 miles per hour.  Biking at a rate of at least 10 miles per hour.  Lap swimming.  Roller-skating or in-line skating.  Cross-country skiing.  Vigorous competitive sports, such as football, basketball, and soccer.  Jumping rope.  Aerobic dancing. HOW CAN I BE MORE ACTIVE IN MY DAY-TO-DAY ACTIVITIES?  Use the stairs instead of the elevator.  Take a walk during your lunch break.  If you drive, park your car farther away from work or school.  If you take public transportation, get off one stop early and walk the rest of the way.  Make all of your phone calls while standing up and walking around.  Get up, stretch, and walk around every 30 minutes throughout the day. WHAT GUIDELINES SHOULD I FOLLOW WHILE EXERCISING?  Do not exercise so much that you hurt yourself, feel dizzy, or get very short of breath.  Consult your health care provider prior to starting a new exercise program.  Wear comfortable clothes and shoes with good support.  Drink plenty of water while you exercise to prevent dehydration or heat stroke. Body water is lost during exercise and must be replaced.  Work out until you breathe faster and your heart beats faster.   This information is not intended to replace advice given to you by your health care provider.  Make sure you discuss any questions you have with your health care provider.   Document Released: 06/13/2010 Document Revised: 06/01/2014 Document Reviewed: 10/12/2013 Elsevier  Interactive Patient Education 2016 [employer].    1 Tbsp-- a Ping-Pong ball.  2 Tbsp--1 Ping-Pong ball.   cup--1 tennis ball or 1 cupcake wrapper.  1 cup--1 baseball.   This information is not intended to replace advice given to you by your health care provider. Make sure you discuss any questions you have with your health care provider.   Document Released: 05/11/2005 Document Revised: 06/01/2014 Document Reviewed: 03/16/2013 Elsevier Interactive Patient Education [employer]

## 2015-08-07 NOTE — Progress Notes (Signed)
Pre visit review using our clinic review tool, if applicable. No additional management support is needed unless otherwise documented below in the visit note. 

## 2015-08-07 NOTE — Progress Notes (Signed)
Dr. Karleen Hampshire T. Von Quintanar, MD, CAQ Sports Medicine Primary Care and Sports Medicine [address] [place], [address] Phone: [phone] Fax: [phone]  08/07/2015  Patient: Kevin [name], MRN: [id number], DOB: 15-Jul-1960, 55 y.o.  Primary Physician:  Hannah Beat, MD   Chief Complaint  Patient presents with  . Back Pain    Mid-Lower  . Sciatica   Subjective:   Kevin [name] is a 55 y.o. very pleasant male patient who presents with the following: Back Pain  ongoing for approximately: 6 d The patient has had back pain before. The back pain is localized into the lumbar spine area. They also describe no radiculopathy.  Low back near the tailbone area and some needle pulse of pain, and then six inches across the middle of his back. Really hurting and some throbbing pain. Had some spasm.   Ongoing for about a week.  No radiculopathy, but pain down his legs.  No incontinence.   4.5-5/10 pain.  A lot of lifting, max of 20 pounds.   Dietary counselling  No numbness or tingling. No bowel or bladder incontinence. No focal weakness. Prior interventions: n Physical therapy: No Chiropractic manipulations: No Acupuncture: No Osteopathic manipulation: No Heat or cold: Minimal effect  Past Medical History, Surgical History, Family History, Medications, Allergies have been reviewed and updated if relevant.  Patient Active Problem List   Diagnosis Date Noted  . Generalized anxiety disorder 10/01/2014  . 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 Conjunctivitis 01/30/2014  . Severe obesity (BMI >= 40) (HCC) 08/31/2013  . SLEEP APNEA 06/16/2010  . HYPERCHOLESTEROLEMIA 12/31/2008  . 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 HYPERTENSION 12/31/2008    Past Medical History  Diagnosis Date  . Hyperlipidemia   . Hypertension   . Obesity     No past surgical history on file.  Social History   Social History  . Marital Status: Married    Spouse Name:  Eunice Blase  . Number of Children: N/A  . Years of Education: N/A   Occupational History  . 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  [employer]  .  [employer]    (bought out [employer])   Social History Main Topics  . Smoking status: Former Smoker -- 1.00 packs/day for 15 years    Types: Cigarettes    Quit date: 05/25/2014  . Smokeless tobacco: Never Used  . Alcohol Use: No  . Drug Use: No  . Sexual Activity: Not on file   Other Topics Concern  . Not on file   Social History Narrative   Former [employer] '86-2009, E7   Caffeine 4-5 cups daily avg.   Married no kids.    [employer], now [facility]    Family History  Problem Relation Age of Onset  . Heart attack Mother   . Coronary artery disease Mother   . COPD Father   . Diabetes Father     No Known Allergies  Medication list reviewed and updated in full in [facility].  GEN: No fevers, chills. Nontoxic. Primarily MSK c/o today. MSK: Detailed in the HPI GI: tolerating PO intake without difficulty Neuro: As above  Otherwise the pertinent positives of the ROS are noted above.    Objective:   Blood pressure 139/75, pulse 70, temperature 97.7 F (36.5 C), temperature source Oral, height 5' 4.75" (1.645 m), weight 277 lb (125.646 kg).  Gen: Well-developed,well-nourished,in no acute distress; alert,appropriate and cooperative throughout examination HEENT: Normocephalic and atraumatic without obvious abnormalities.  Ears, externally no  deformities Pulm: Breathing comfortably in no respiratory distress Range of motion at  the waist: Flexion, rotation and lateral bending: flexion and ext 50% limitation  No echymosis or edema Rises to examination table with no difficulty Gait: minimally antalgic  Inspection/Deformity: No abnormality Paraspinus T:  Diffuse spasm  B Ankle Dorsiflexion (L5,4): 5/5 B Great Toe Dorsiflexion (L5,4): 5/5 Heel Walk (L5): WNL Toe Walk (S1): WNL Rise/Squat (L4): WNL, mild pain  SENSORY B Medial Foot (L4): WNL B Dorsum (L5): WNL B Lateral  (S1): WNL Light Touch: WNL Pinprick: WNL  REFLEXES Knee (L4): 2+ Ankle (S1): 2+  B SLR, seated: neg B SLR, supine: neg B FABER: neg B Reverse FABER: neg B Greater Troch: NT B Log Roll: neg B Stork: NT B Sciatic Notch: NT  Radiology: No results found.  Assessment and Plan:   Bilateral low back pain without sciatica  Weight loss counseling, encounter for  Anatomy reviewed. Conservative algorithms for acute back pain generally begin with the following: NSAIDS, Muscle Relaxants, Mild pain medication - in this case, already on NSAIDS, add prednisone.  Start with medications, core rehab, and progress from there following low back pain algorithm. No red flags are present.  Follow-up: 4 weeks if not better  New Prescriptions   PREDNISONE (DELTASONE) 20 MG TABLET    2 tabs po for 5 days, then 1 tab po for 5 days   Modified Medications   Modified Medication Previous Medication   ESCITALOPRAM (LEXAPRO) 10 MG TABLET escitalopram (LEXAPRO) 10 MG tablet      Take 1 tablet (10 mg total) by mouth daily.    1 po daily   TRAMADOL (ULTRAM) 50 MG TABLET traMADol (ULTRAM) 50 MG tablet      Take 1 tablet (50 mg total) by mouth every 6 (six) hours as needed.    Take 1 tablet (50 mg total) by mouth 4 (four) times daily as needed.   No orders of the defined types were placed in this encounter.    Signed,  Elpidio GaleaSpencer T. Romello Hoehn, MD   Patient's Medications  New Prescriptions   PREDNISONE (DELTASONE) 20 MG TABLET    2 tabs po for 5 days, then 1 tab po for 5 days  Previous Medications   ASPIRIN 81 MG TABLET    Take 81 mg by mouth daily.     LISINOPRIL (PRINIVIL,ZESTRIL) 20 MG TABLET    TAKE 1 TABLET DAILY   MELOXICAM (MOBIC) 15 MG TABLET    TK 1 T PO QAM WITH FOOD   MULTIPLE VITAMINS-MINERALS (MULTIVITAMIN WITH MINERALS) TABLET    Take 1 tablet by mouth daily.   SIMVASTATIN (ZOCOR) 40 MG TABLET    TAKE 1 TABLET DAILY (NEED OFFICE VISIT)  Modified Medications   Modified Medication Previous  Medication   ESCITALOPRAM (LEXAPRO) 10 MG TABLET escitalopram (LEXAPRO) 10 MG tablet      Take 1 tablet (10 mg total) by mouth daily.    1 po daily   TRAMADOL (ULTRAM) 50 MG TABLET traMADol (ULTRAM) 50 MG tablet      Take 1 tablet (50 mg total) by mouth every 6 (six) hours as needed.    Take 1 tablet (50 mg total) by mouth 4 (four) times daily as needed.  Discontinued Medications   No medications on file

## 2015-10-06 ENCOUNTER — Other Ambulatory Visit: Payer: Self-pay | Admitting: Family Medicine

## 2015-10-14 ENCOUNTER — Encounter: Payer: Self-pay | Admitting: Family Medicine

## 2015-10-14 ENCOUNTER — Ambulatory Visit (INDEPENDENT_AMBULATORY_CARE_PROVIDER_SITE_OTHER)
Admission: RE | Admit: 2015-10-14 | Discharge: 2015-10-14 | Disposition: A | Discharge status: Home / Self Care | Payer: BLUE CROSS/BLUE SHIELD | Source: Ambulatory Visit | Attending: Family Medicine | Admitting: Family Medicine

## 2015-10-14 ENCOUNTER — Ambulatory Visit (INDEPENDENT_AMBULATORY_CARE_PROVIDER_SITE_OTHER): Payer: BLUE CROSS/BLUE SHIELD | Admitting: Family Medicine

## 2015-10-14 VITALS — BP 100/58 | HR 101 | Temp 98.4°F | Ht 64.0 in | Wt 266.5 lb

## 2015-10-14 DIAGNOSIS — M25562 Pain in left knee: Secondary | ICD-10-CM | POA: Diagnosis not present

## 2015-10-14 DIAGNOSIS — M2392 Unspecified internal derangement of left knee: Secondary | ICD-10-CM
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
## 2015-10-14 NOTE — Progress Notes (Signed)
Pre visit review using our clinic review tool, if applicable. No additional management support is needed unless otherwise documented below in the visit note. 

## 2015-10-14 NOTE — Patient Instructions (Signed)

## 2015-10-18 ENCOUNTER — Ambulatory Visit (HOSPITAL_COMMUNITY)
Admission: RE | Admit: 2015-10-18 | Discharge: 2015-10-18 | Disposition: A | Discharge status: Home / Self Care | Payer: BLUE CROSS/BLUE SHIELD | Source: Ambulatory Visit | Attending: Family Medicine | Admitting: Family Medicine

## 2015-10-18 DIAGNOSIS — M23222 Derangement of posterior horn of medial meniscus due to old tear or injury, left knee: Secondary | ICD-10-CM | POA: Diagnosis not present

## 2015-10-18 DIAGNOSIS — M2410 Other articular cartilage disorders, unspecified site: Secondary | ICD-10-CM | POA: Diagnosis not present

## 2015-10-18 DIAGNOSIS — M25562 Pain in left knee: Secondary | ICD-10-CM | POA: Insufficient documentation

## 2015-10-18 DIAGNOSIS — M25462 Effusion, left knee: Secondary | ICD-10-CM | POA: Diagnosis not present

## 2015-10-18 DIAGNOSIS — M238X2 Other internal derangements of left knee: Secondary | ICD-10-CM | POA: Diagnosis not present

## 2015-10-18 DIAGNOSIS — M2392 Unspecified internal derangement of left knee: Secondary | ICD-10-CM

## 2016-01-05 ENCOUNTER — Other Ambulatory Visit: Payer: Self-pay | Admitting: Family Medicine

## 2016-02-28 ENCOUNTER — Other Ambulatory Visit: Payer: Self-pay | Admitting: Family Medicine

## 2016-02-28 NOTE — Telephone Encounter (Signed)
Last office visit 10/14/2015 for knee pain. Last CPE 01/16/2015.  No future appointments.  Refill?

## 2016-03-12 ENCOUNTER — Telehealth: Payer: Self-pay | Admitting: Neurology

## 2016-03-12 NOTE — Telephone Encounter (Signed)
Pt's wife called in for a rx for another cpap mask. He was last seen Jan/2016. She made an appt for the pt for Dec 15th first avail. She wants to know about coming in earlier or getting the rx before the appt.

## 2016-03-12 NOTE — Telephone Encounter (Signed)
No sooner appts. I will call if a sooner appt comes available. We cannot provide cpap supplies unless the pt has been seen by [facility] within one year.

## 2016-03-16 NOTE — Telephone Encounter (Signed)
I spoke to pt and offered him a sooner appt of 04/01/2016 at 10:00am. Pt verbalized understanding of appt date and time.

## 2016-03-16 NOTE — Telephone Encounter (Signed)
I called pt's wife. No answer, left a message asking her to call me back.

## 2016-04-01 ENCOUNTER — Telehealth: Payer: Self-pay

## 2016-04-01 ENCOUNTER — Ambulatory Visit: Payer: Self-pay | Admitting: Neurology

## 2016-04-01 NOTE — Telephone Encounter (Signed)
Pt did not show for their appt with Dr. Dohmeier today.  

## 2016-04-02 ENCOUNTER — Encounter: Payer: Self-pay | Admitting: Neurology

## 2016-04-27 ENCOUNTER — Ambulatory Visit (INDEPENDENT_AMBULATORY_CARE_PROVIDER_SITE_OTHER): Payer: BLUE CROSS/BLUE SHIELD | Admitting: Neurology

## 2016-04-27 ENCOUNTER — Encounter: Payer: Self-pay | Admitting: Neurology

## 2016-04-27 VITALS — BP 128/78 | HR 78 | Resp 18 | Ht 64.0 in | Wt 268.0 lb

## 2016-04-27 DIAGNOSIS — Z9989 Dependence on other enabling machines and devices: Secondary | ICD-10-CM

## 2016-04-27 DIAGNOSIS — G4733 Obstructive sleep apnea (adult) (pediatric): Secondary | ICD-10-CM

## 2016-04-27 HISTORY — DX: Obstructive sleep apnea (adult) (pediatric): G47.33

## 2016-04-27 NOTE — Progress Notes (Signed)
SLEEP MEDICINE CLINIC   Provider:  Melvyn Novas, M D  Referring Provider: Hannah Beat, MD Primary Care Physician:  Hannah Beat, MD  Chief Complaint  Patient presents with  . Sleep Apnea    Rm 11. F/U. Patient is here for CPAP f/u. He needs new supplies including a new mask. He has not used his CPAP recently due to a tear in the mask. DME: [facility]    HPI:  Kevin [name] is a 55 y.o. male seen here as a referral  from Dr. Patsy Lager for a sleep medicine evaluation,   Chief complaint is excessive daytime sleepiness, culminating in multiple daytime naps ,worsening over the last 12 month. Snoring since 2010.    The patient usually reaches home after work at about 4 PM and to at that time likes to watch TV for a brief time "until dinner is ready" -often his wife has to wake him when dinner finally is ready ,because he fell inadvertently asleep and is loudly snoring . [name] report that he feels better after a power nap, refreshed for about an hour, then will "" crash again and feel excessively sleepy".  He sometimes has 2-3 power naps before finally going to the bedroom at about 9:00 at night. He will fall asleep in less than 30 minutes. He describes his bedroom is cool, quiet and dark. He sleeps on one pillow only, he falls asleep usually on his right side, but when he wakes up at night he will find himself supine. He has one nocturia at night, he falls asleep again, he snores again.  She had witnessed him to snore so loudly, that the couple has actually slept in separate rooms.  She witnesses frequent apneas.  He wakes in the morning with a very dry mouth, some nights with headaches, wakes up spontaneously at 4 .30 in AM to get ready for work.  He is not refreshed.   He works from 6 AM to 3 PM, no change in shift work.  One cup of coffee in the morning, drives to work. He does not drink caffeine later than noon, likes iced tea mixed with cool aid at lunch.  he quit smoking  only 14 days ago .No ETOH.   He has chronic nasal airway obstruction and is a mouth breather. He has seasonal allergies, and he is prone to respiratory cold and sinuitis. He has frequent bronchitis. He is a thunderous snorer independent of his sleep position.  He didn't snore in 2007, but begun in 2010 after significant weight gain.  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 Married for 8 years, no children, no known family history of apnea or other sleep disorders. He was never a sleep walker , but on occasion is sleep talking.   I had the pleasure of seeing [name] today on 04/27/2016 after he underwent a split-night polysomnography. His sleep study actually took place on the ninth of Fabry 2016, and at the time he was diagnosed with one of the highest apnea indices we have ever seen 131.7 per hour of sleep. There was even a little bit of REM and supine sleep accentuation and supine sleep his AHI was 141. He was titrated to 14 cm water pressure which reduced his AHI down to 8.8 but did not at the time allow for complete resolution. Remarkable was that he stayed in normal sinus rhythm during the CPAP titration and did not have periodic limb movements. The lowest oxygen  saturation prior to CPAP use was 77% at nadir and CO2 was retained at 51.6 torr. I have placed the patient on an auto titrate her and I have not a pleasure to see that he has used the machine for the months of July and August at 82%. In September however is interface broke and he was not able to use his CPAP until he sees me for supplies. His residual AHI was only 2.1 under the use of an auto titrate her and the 95th percentile pressure needed was 15 cm water. He has also started to lose some weight.   Review of Systems: Out of a complete 14 system review, the patient complains of only the following symptoms, and all other reviewed systems are negative. The patient endorsed weight loss. He is a higher level of  energy.   Epworth score 16 , Fatigue severity score 30  , depression score 4  Pre CPAP, now on CPAP,    Social History   Social History  . Marital status: Married    Spouse name: Kevin [name]  . Number of children: N/A  . 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  [employer]  .  [employer]    (bought out [employer])   Social History Main Topics  . Smoking status: Current Every Day Smoker    Packs/day: 1.00    Years: 15.00    Types: Cigarettes  . Smokeless tobacco: Never Used  . Alcohol use No  . Drug use: No  . Sexual activity: Not on file   Other Topics Concern  . Not on file   Social History Narrative   Former [employer] '86-2009, E7   Caffeine 4-5 cups daily avg.   Married no kids.    [employer] now [facility]    Family History  Problem Relation Age of Onset  . Heart attack Mother   . Coronary artery disease Mother   . COPD Father   . Diabetes Father     Past Medical History:  Diagnosis Date  . Hyperlipidemia   . Hypertension   . Obesity     No past surgical history on file.  Current Outpatient Prescriptions  Medication Sig Dispense Refill  . acetaminophen (TYLENOL) 500 MG tablet Take 500 mg by mouth every 6 (six) hours as needed.    Marland Kitchen. aspirin 81 MG tablet Take 81 mg by mouth daily.      Marland Kitchen. escitalopram (LEXAPRO) 10 MG tablet TAKE 1 TABLET DAILY 90 tablet 3  . lisinopril (PRINIVIL,ZESTRIL) 20 MG tablet TAKE 1 TABLET DAILY 90 tablet 3  . meloxicam (MOBIC) 15 MG tablet TK 1 T PO QAM WITH FOOD  2  . Multiple Vitamins-Minerals (MULTIVITAMIN WITH MINERALS) tablet Take 1 tablet by mouth daily.    . simvastatin (ZOCOR) 40 MG tablet TAKE 1 TABLET DAILY (NEED OFFICE VISIT) 90 tablet 3   No current facility-administered medications for this visit.     Allergies as of 04/27/2016  . (No Known Allergies)    Vitals: BP 128/78   Pulse 78   Resp 18   Ht 5\' 4"  (1.626 m)   Wt 268 lb (121.6 kg)   BMI 46.00 kg/m  Last Weight:  Wt Readings from Last  1 Encounters:  04/27/16 268 lb (121.6 kg)       Last Height:   Ht Readings from Last 1 Encounters:  04/27/16 5\' 4"  (1.626 m)    Physical exam:  General: The patient is  awake, alert and appears not in acute distress. The patient is well groomed. He has a full beard,  Head: Normocephalic, atraumatic. Neck is supple. Mallampati 5  neck circumference: 19. Nasal airflow very restricted,Cardiovascular:  Regular rate and rhythm , without  murmurs or carotid bruit, and without distended neck veins. Respiratory: Lungs are clear to auscultation. Skin:  Without evidence of  Ankle edema, or rash Trunk: BMI is elevated ,patient has normal posture.  Neurologic exam : The patient is awake and alert, oriented to place and time.   Memory subjective   described as intact. There is a normal attention span & concentration ability. Speech is fluent with dysphonia . Mood and affect are appropriate.  Cranial nerves: Pupils are equal and briskly reactive to light. Noted  Nystagmus in horizonatl gaze on left eye only, with fast componant to the midline.  Visual fields by finger perimetry are intact. Hearing to finger rub intact.  Rinne- Weber normal. Facial sensation intact to fine touch. Facial motor strength is symmetric and tongue and uvula move midline. Shoulder shrug and tongue protrusion normal.   Motor exam:  Normal tone, muscle bulk and symmetric,strength in all extremities Coordination: Rapid alternating movements in the fingers/hands is normal. Finger-to-nose maneuver  normal without evidence of ataxia, dysmetria or tremor.   The patient was advised of the nature of the diagnosed sleep disorder , the treatment options and risks for general a health and wellness arising from not treating the condition. Visit duration face to face was 25 minutes with more than 50 % of the time spent in discussion of risk factor for apnea, testing modalities and treatment options, such as CPAP, ENT or dental devices.    Assessment:  After physical and neurologic examination, review of laboratory studies, imaging, neurophysiology testing and pre-existing records, assessment is      #1 OSA - Kevin [name] was diagnosed with a very severe form of obstructive sleep apnea 111 obstructive apneas were noted, 11 central apneas and 24 shallow breathing spells. He is doing well now on an auto titrateor, using 15 cm water for much of the night. He has not been nearly as sleepy after using CPAP in comparison to before. He does not have nocturia, he does not snore or using CPAP. This has also helped his wife get more sleep. #2 Obesity:  risk factor management would include weight loss, he has morbid obesity, likely he will continue to lose weight by low carb diet.  #3 additional pulmonary and ENT health factors, he has bronchitis, sinusitis and pneumonia, needed albuterol frequently - Just stopped smoking. Smoking cessation needs to be maintained.     Plan:  Treatment plan and additional workup : Patient will resume CPAP but needs urgently a new interface, he is currently using a a mouth covering lumbar review in large size. He is using an air sense machine by [facility] and needs a new filter ,tubing head gear, etc. DME  advancned home care  Rv in 12 month .      Kevin [name] [place] MD  04/27/2016

## 2016-04-27 NOTE — Patient Instructions (Signed)
Carbohydrate Counting for Obesity, Adult Carbohydrate counting is a method for keeping track of how many carbohydrates you eat. Eating carbohydrates naturally increases the amount of sugar (glucose) in the blood. Counting how many carbohydrates you eat helps keep your blood glucose within normal limits, which helps you manage your diabetes (diabetes mellitus). It is important to know how many carbohydrates you can safely have in each meal. This is different for every person. A diet and nutrition specialist (registered dietitian) can help you make a meal plan and calculate how many carbohydrates you should have at each meal and snack. Carbohydrates are found in the following foods:  Grains, such as breads and cereals.  Dried beans and soy products.  Starchy vegetables, such as potatoes, peas, and corn.  Fruit and fruit juices.  Milk and yogurt.  Sweets and snack foods, such as cake, cookies, candy, chips, and soft drinks. How do I count carbohydrates? There are two ways to count carbohydrates in food. You can use either of the methods or a combination of both. Reading "Nutrition Facts" on packaged food  The "Nutrition Facts" list is included on the labels of almost all packaged foods and beverages in the [place] It includes:  The serving size.  Information about nutrients in each serving, including the grams (g) of carbohydrate per serving. To use the "Nutrition Facts":  Decide how many servings you will have.  Multiply the number of servings by the number of carbohydrates per serving.  The resulting number is the total amount of carbohydrates that you will be having. Learning standard serving sizes of other foods  When you eat foods containing carbohydrates that are not packaged or do not include "Nutrition Facts" on the label, you need to measure the servings in order to count the amount of carbohydrates:  Measure the foods that you will eat with a food scale or measuring cup, if  needed.  Decide how many standard-size servings you will eat.  Multiply the number of servings by 15. Most carbohydrate-rich foods have about 15 g of carbohydrates per serving.  For example, if you eat 8 oz (170 g) of strawberries, you will have eaten 2 servings and 30 g of carbohydrates (2 servings x 15 g = 30 g).  For foods that have more than one food mixed, such as soups and casseroles, you must count the carbohydrates in each food that is included. The following list contains standard serving sizes of common carbohydrate-rich foods. Each of these servings has about 15 g of carbohydrates:   hamburger bun or  English muffin.   oz (15 mL) syrup.   oz (14 g) jelly.  1 slice of bread.  1 six-inch tortilla.  3 oz (85 g) cooked rice or pasta.  4 oz (113 g) cooked dried beans.  4 oz (113 g) starchy vegetable, such as peas, corn, or potatoes.  4 oz (113 g) hot cereal.  4 oz (113 g) mashed potatoes or  of a large baked potato.  4 oz (113 g) canned or frozen fruit.  4 oz (120 mL) fruit juice.  4-6 crackers.  6 chicken nuggets.  6 oz (170 g) unsweetened dry cereal.  6 oz (170 g) plain fat-free yogurt or yogurt sweetened with artificial sweeteners.  8 oz (240 mL) milk.  8 oz (170 g) fresh fruit or one small piece of fruit.  24 oz (680 g) popped popcorn. Example of carbohydrate counting Sample meal  3 oz (85 g) chicken breast.  6 oz (170  g) brown rice.  4 oz (113 g) corn.  8 oz (240 mL) milk.  8 oz (170 g) strawberries with sugar-free whipped topping. Carbohydrate calculation 1. Identify the foods that contain carbohydrates:  Rice.  Corn.  Milk.  Strawberries. 2. Calculate how many servings you have of each food:  2 servings rice.  1 serving corn.  1 serving milk.  1 serving strawberries. 3. Multiply each number of servings by 15 g:  2 servings rice x 15 g = 30 g.  1 serving corn x 15 g = 15 g.  1 serving milk x 15 g = 15 g.  1  serving strawberries x 15 g = 15 g. 4. Add together all of the amounts to find the total grams of carbohydrates eaten:  30 g + 15 g + 15 g + 15 g = 75 g of carbohydrates total. This information is not intended to replace advice given to you by your health care provider. Make sure you discuss any questions you have with your health care provider. Document Released: 05/11/2005 Document Revised: 11/29/2015 Document Reviewed: 10/23/2015 Elsevier Interactive Patient Education  2017 [employer]

## 2016-05-06 ENCOUNTER — Ambulatory Visit: Payer: Self-pay | Admitting: Neurology

## 2016-06-14 ENCOUNTER — Other Ambulatory Visit: Payer: Self-pay | Admitting: Family Medicine

## 2016-06-14 NOTE — Telephone Encounter (Signed)
Last office visit 10/14/15 for knee ain.  Last CPE 01/16/15.  Last Lipid 01/14/15.  Refill?

## 2016-06-15 MED ORDER — SIMVASTATIN 40 MG PO TABS: ORAL_TABLET | ORAL | 0 refills | Status: DC

## 2016-06-15 NOTE — Addendum Note (Signed)
Addended by: Patience MuscaISLEY, Zinedine Ellner M on: 06/15/2016 03:02 PM   Modules accepted: Orders

## 2016-06-15 NOTE — Telephone Encounter (Signed)
Mrs Kevin [name] said pt out of simvastatin for few weeks; advised sent to mail order pharmacy today.Simvastatin # 15 will be sent to [facility] [address] Mrs Kevin [name] voiced understanding.

## 2016-06-26 ENCOUNTER — Ambulatory Visit (INDEPENDENT_AMBULATORY_CARE_PROVIDER_SITE_OTHER): Admitting: Primary Care

## 2016-06-26 ENCOUNTER — Encounter: Payer: Self-pay | Admitting: Primary Care

## 2016-06-26 VITALS — BP 148/84 | HR 91 | Temp 99.9°F | Ht 64.0 in | Wt 274.1 lb

## 2016-06-26 DIAGNOSIS — J069 Acute upper respiratory infection, unspecified: Secondary | ICD-10-CM

## 2016-06-26 MED ORDER — BENZONATATE 200 MG PO CAPS: 200.0000 mg | ORAL_CAPSULE | Freq: Three times a day (TID) | ORAL | 0 refills | Status: DC | PRN

## 2016-06-26 MED ORDER — ALBUTEROL SULFATE HFA 108 (90 BASE) MCG/ACT IN AERS
2.0000 | INHALATION_SPRAY | Freq: Four times a day (QID) | RESPIRATORY_TRACT | 0 refills | Status: DC | PRN
Start: 2016-06-26 — End: 2017-02-01

## 2016-06-26 NOTE — Progress Notes (Signed)
Subjective:    Patient ID: Kevin [name], male    DOB: 03/11/1961, 56 y.o.   MRN: [id number]  HPI  Mr. Vanlanen is a 56 year old male with a history of OSA who presents today with a chief complaint of nasal congestion. He also reports headache, chills, sore throat, cough, body aches. His cough is mostly dry. His symptoms began 2 days ago. He's taken tylenol for fevers that are running up to 101. He started feeling worse this morning.  Review of Systems  Constitutional: Positive for chills, fatigue and fever.  HENT: Positive for congestion. Negative for ear pain, postnasal drip and sinus pressure.   Respiratory: Positive for chest tightness and wheezing. Negative for shortness of breath.   Cardiovascular: Negative for chest pain.       Past Medical History:  Diagnosis Date  . Hyperlipidemia   . Hypertension   . Obesity      Social History   Social History  . Marital status: Married    Spouse name: Eunice Blase  . Number of children: N/A  . 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  [employer]  .  [employer]    (bought out [employer])   Social History Main Topics  . Smoking status: Current Every Day Smoker    Packs/day: 1.00    Years: 15.00    Types: Cigarettes  . Smokeless tobacco: Never Used  . Alcohol use No  . Drug use: No  . Sexual activity: Not on file   Other Topics Concern  . Not on file   Social History Narrative   Former [employer]-2009, E7   Caffeine 4-5 cups daily avg.   Married no kids.    [employer], now [employer]    No past surgical history on file.  Family History  Problem Relation Age of Onset  . Heart attack Mother   . Coronary artery disease Mother   . COPD Father   . Diabetes Father     No Known Allergies  Current Outpatient Prescriptions on File Prior to Visit  Medication Sig Dispense Refill  . acetaminophen (TYLENOL) 500 MG tablet Take 500 mg by mouth every 6 (six) hours as needed.    Marland Kitchen aspirin 81 MG tablet  Take 81 mg by mouth daily.      Marland Kitchen escitalopram (LEXAPRO) 10 MG tablet TAKE 1 TABLET DAILY 90 tablet 3  . lisinopril (PRINIVIL,ZESTRIL) 20 MG tablet TAKE 1 TABLET DAILY 90 tablet 3  . meloxicam (MOBIC) 15 MG tablet TK 1 T PO QAM WITH FOOD  2  . Multiple Vitamins-Minerals (MULTIVITAMIN WITH MINERALS) tablet Take 1 tablet by mouth daily.    . simvastatin (ZOCOR) 40 MG tablet TAKE 1 TABLET DAILY (NEED OFFICE VISIT) 15 tablet 0   No current facility-administered medications on file prior to visit.     BP (!) 148/84   Pulse 91   Temp 99.9 F (37.7 C) (Oral)   Ht 5\' 4"  (1.626 m)   Wt 274 lb 1.9 oz (124.3 kg)   SpO2 98%   BMI 47.05 kg/m    Objective:   Physical Exam  Constitutional: He appears well-nourished. He appears ill.  HENT:  Right Ear: Tympanic membrane and ear canal normal.  Left Ear: Tympanic membrane and ear canal normal.  Nose: No mucosal edema. Right sinus exhibits no maxillary sinus tenderness and no frontal sinus tenderness. Left sinus exhibits no maxillary sinus tenderness and no frontal sinus tenderness.  Mouth/Throat: Oropharynx is clear and moist.  Eyes: Conjunctivae are normal.  Neck: Neck supple.  Cardiovascular: Normal rate and regular rhythm.   Pulmonary/Chest: Effort normal and breath sounds normal. He has no wheezes. He has no rales.  Mild wheezing to left upper field with cough  Skin: Skin is warm and dry.          Assessment & Plan:  URI:  Cough, congestion, body aches, fevers x 2 days. Improvement in fevers with tylenol, has not taken anything else. Exam today mostly unremarkable, lungs clear. Suspect viral involvement and will treat with conservative measures. Rx for albuterol inhaler PRN for chest tightness and wheezing. Rx for Tessalon Pearls sent for cough. Mucinex, fluids, rest. Work note provided.  Morrie Sheldonlark,Katherine Kendal, NP

## 2016-06-26 NOTE — Patient Instructions (Signed)
Your symptoms are representative of a viral illness which will resolve on its own over time. Our goal is to treat your symptoms in order to aid your body in the healing process and to make you more comfortable.   Albuterol Inhaler: Inhale 2 puffs into the lungs every 6 to 8 hours as needed for wheezing and/or shortness of breath.   You may take Benzonatate capsules for cough. Take 1 capsule by mouth three times daily as needed for cough.  Please notify me if you develop persistent fevers of 101, start coughing up green mucous, notice increased fatigue or weakness, or feel worse after 1 week of onset of symptoms.   Increase consumption of water intake and rest.  It was a pleasure meeting you!   Upper Respiratory Infection, Adult Most upper respiratory infections (URIs) are a viral infection of the air passages leading to the lungs. A URI affects the nose, throat, and upper air passages. The most common type of URI is nasopharyngitis and is typically referred to as "the common cold." URIs run their course and usually go away on their own. Most of the time, a URI does not require medical attention, but sometimes a bacterial infection in the upper airways can follow a viral infection. This is called a secondary infection. Sinus and middle ear infections are common types of secondary upper respiratory infections. Bacterial pneumonia can also complicate a URI. A URI can worsen asthma and chronic obstructive pulmonary disease (COPD). Sometimes, these complications can require emergency medical care and may be life threatening. What are the causes? Almost all URIs are caused by viruses. A virus is a type of germ and can spread from one person to another. What increases the risk? You may be at risk for a URI if:  You smoke.  You have chronic heart or lung disease.  You have a weakened defense (immune) system.  You are very young or very old.  You have nasal allergies or asthma.  You work in  crowded or poorly ventilated areas.  You work in health care facilities or schools. What are the signs or symptoms? Symptoms typically develop 2-3 days after you come in contact with a cold virus. Most viral URIs last 7-10 days. However, viral URIs from the influenza virus (flu virus) can last 14-18 days and are typically more severe. Symptoms may include:  Runny or stuffy (congested) nose.  Sneezing.  Cough.  Sore throat.  Headache.  Fatigue.  Fever.  Loss of appetite.  Pain in your forehead, behind your eyes, and over your cheekbones (sinus pain).  Muscle aches. How is this diagnosed? Your health care provider may diagnose a URI by:  Physical exam.  Tests to check that your symptoms are not due to another condition such as:  Strep throat.  Sinusitis.  Pneumonia.  Asthma. How is this treated? A URI goes away on its own with time. It cannot be cured with medicines, but medicines may be prescribed or recommended to relieve symptoms. Medicines may help:  Reduce your fever.  Reduce your cough.  Relieve nasal congestion. Follow these instructions at home:  Take medicines only as directed by your health care provider.  Gargle warm saltwater or take cough drops to comfort your throat as directed by your health care provider.  Use a warm mist humidifier or inhale steam from a shower to increase air moisture. This may make it easier to breathe.  Drink enough fluid to keep your urine clear or pale yellow.  Eat soups and other clear broths and maintain good nutrition.  Rest as needed.  Return to work when your temperature has returned to normal or as your health care provider advises. You may need to stay home longer to avoid infecting others. You can also use a face mask and careful hand washing to prevent spread of the virus.  Increase the usage of your inhaler if you have asthma.  Do not use any tobacco products, including cigarettes, chewing tobacco, or  electronic cigarettes. If you need help quitting, ask your health care provider. How is this prevented? The best way to protect yourself from getting a cold is to practice good hygiene.  Avoid oral or hand contact with people with cold symptoms.  Wash your hands often if contact occurs. There is no clear evidence that vitamin C, vitamin E, echinacea, or exercise reduces the chance of developing a cold. However, it is always recommended to get plenty of rest, exercise, and practice good nutrition. Contact a health care provider if:  You are getting worse rather than better.  Your symptoms are not controlled by medicine.  You have chills.  You have worsening shortness of breath.  You have brown or red mucus.  You have yellow or brown nasal discharge.  You have pain in your face, especially when you bend forward.  You have a fever.  You have swollen neck glands.  You have pain while swallowing.  You have white areas in the back of your throat. Get help right away if:  You have severe or persistent:  Headache.  Ear pain.  Sinus pain.  Chest pain.  You have chronic lung disease and any of the following:  Wheezing.  Prolonged cough.  Coughing up blood.  A change in your usual mucus.  You have a stiff neck.  You have changes in your:  Vision.  Hearing.  Thinking.  Mood. This information is not intended to replace advice given to you by your health care provider. Make sure you discuss any questions you have with your health care provider. Document Released: 11/04/2000 Document Revised: 01/12/2016 Document Reviewed: 08/16/2013 Elsevier Interactive Patient Education  2017 [employer]

## 2016-06-26 NOTE — Progress Notes (Signed)
Pre visit review using our clinic review tool, if applicable. No additional management support is needed unless otherwise documented below in the visit note. 

## 2016-06-29 ENCOUNTER — Encounter: Payer: Self-pay | Admitting: Internal Medicine

## 2016-06-29 ENCOUNTER — Ambulatory Visit (INDEPENDENT_AMBULATORY_CARE_PROVIDER_SITE_OTHER): Admitting: Internal Medicine

## 2016-06-29 VITALS — BP 128/68 | HR 81 | Temp 97.9°F | Resp 24 | Wt 275.0 lb

## 2016-06-29 DIAGNOSIS — R69 Illness, unspecified: Secondary | ICD-10-CM

## 2016-06-29 DIAGNOSIS — J111 Influenza due to unidentified influenza virus with other respiratory manifestations: Secondary | ICD-10-CM

## 2016-06-29 NOTE — Progress Notes (Signed)
Subjective:    Patient ID: Kevin [name], male    DOB: 06/22/1960, 56 y.o.   MRN: [id number]  HPI Here due to ongoing respiratory illness Here with wife  Started with illness ~4 days ago Chills and intermittent fever. Some sweats "I feel like everything is taken out of me" Strength poor-- balance not great due to this Having body aches also  Some cough--- occasional phlegm (but not consistent) Some head pressure Not really SOB Occasional dizziness with severe cough Some ear pain  Using inhaler prn and mucinex  Current Outpatient Prescriptions on File Prior to Visit  Medication Sig Dispense Refill  . acetaminophen (TYLENOL) 500 MG tablet Take 500 mg by mouth every 6 (six) hours as needed.    Marland Kitchen. albuterol (PROVENTIL HFA;VENTOLIN HFA) 108 (90 Base) MCG/ACT inhaler Inhale 2 puffs into the lungs every 6 (six) hours as needed for wheezing or shortness of breath. 1 Inhaler 0  . Ascorbic Acid (VITAMIN C) 1000 MG tablet Take 1,000 mg by mouth daily.    Marland Kitchen. aspirin 81 MG tablet Take 81 mg by mouth daily.      . benzonatate (TESSALON) 200 MG capsule Take 1 capsule (200 mg total) by mouth 3 (three) times daily as needed for cough. 30 capsule 0  . escitalopram (LEXAPRO) 10 MG tablet TAKE 1 TABLET DAILY 90 tablet 3  . lisinopril (PRINIVIL,ZESTRIL) 20 MG tablet TAKE 1 TABLET DAILY 90 tablet 3  . meloxicam (MOBIC) 15 MG tablet TK 1 T PO QAM WITH FOOD  2  . Multiple Vitamin (MULTIVITAMIN) tablet Take 1 tablet by mouth daily.    . Multiple Vitamins-Minerals (MULTIVITAMIN WITH MINERALS) tablet Take 1 tablet by mouth daily.    . simvastatin (ZOCOR) 40 MG tablet TAKE 1 TABLET DAILY (NEED OFFICE VISIT) 15 tablet 0   No current facility-administered medications on file prior to visit.     No Known Allergies  Past Medical History:  Diagnosis Date  . Hyperlipidemia   . Hypertension   . Obesity     No past surgical history on file.  Family History  Problem Relation Age of Onset  . Heart  attack Mother   . Coronary artery disease Mother   . COPD Father   . Diabetes Father     Social History   Social History  . Marital status: Married    Spouse name: Kevin [name]  . Number of children: N/A  . 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  [employer]  .  [employer]    (bought out [employer])   Social History Main Topics  . Smoking status: Current Every Day Smoker    Packs/day: 1.00    Years: 15.00    Types: Cigarettes  . Smokeless tobacco: Never Used  . Alcohol use No  . Drug use: No  . Sexual activity: Not on file   Other Topics Concern  . Not on file   Social History Narrative   Former [employer]-2009, E7   Caffeine 4-5 cups daily avg.   Married no kids.    [employer] now [employer]   Review of Systems Appetite is poor Drinking fluids fair No vomiting or diarrhea No rash Hasn't smoked since this illness started    Objective:   Physical Exam  HENT:  No sinus tenderness  Moderate nasal inflammation TMs normal No sig pharyngeal injection or exudates  Neck: Neck supple. No thyromegaly present.  Pulmonary/Chest: Effort normal. No respiratory distress.  He has no rales.  Fair air movement Slight expiratory wheezes but not really tight  Lymphadenopathy:    He has no cervical adenopathy.  Skin: No rash noted.          Assessment & Plan:

## 2016-06-29 NOTE — Assessment & Plan Note (Signed)
Fairly classic presentation--if not terribly severe Still not able to work-- discussed analgesics and OOW till 2/7 or 2/8 Not worth testing now No signs of secondary sinusitis or pneumonia Discussed cigarette cessation

## 2016-06-29 NOTE — Progress Notes (Signed)
Pre visit review using our clinic review tool, if applicable. No additional management support is needed unless otherwise documented below in the visit note. 

## 2016-07-09 ENCOUNTER — Telehealth: Payer: Self-pay | Admitting: Family Medicine

## 2016-07-09 DIAGNOSIS — Z7689 Persons encountering health services in other specified circumstances: Secondary | ICD-10-CM

## 2016-07-09 NOTE — Telephone Encounter (Signed)
fmla paperwork dropped off In dr Alphonsus [place] IN BOX  For review and signature

## 2016-07-09 NOTE — Telephone Encounter (Signed)
Form signed.

## 2016-07-15 NOTE — Telephone Encounter (Signed)
Paperwork faxed 07/10/16 Copy for pt Copy for file Copy for scan Copy for billing  Tried calling pt

## 2016-07-15 NOTE — Telephone Encounter (Signed)
Spouse aware.

## 2016-08-19 ENCOUNTER — Ambulatory Visit: Admitting: Family Medicine

## 2016-12-17 ENCOUNTER — Other Ambulatory Visit: Payer: Self-pay | Admitting: Family Medicine

## 2016-12-17 ENCOUNTER — Other Ambulatory Visit: Payer: Self-pay

## 2016-12-17 NOTE — Telephone Encounter (Signed)
Stanton Kidneyebra (DPR signed) left v/m requesting refill simvastatin to [facility] [address] Last CPX on 01/16/15 and last acute visit 06/29/16. Pt has one wk of med left. No future appt scheduled.Please advise.

## 2016-12-17 NOTE — Telephone Encounter (Signed)
I refused refill earlier today.  Last month was given #15 and advised to make an appointment.  Has not had Lipid checked since 12/2014.  Refill?

## 2016-12-18 NOTE — Telephone Encounter (Signed)
Per DPR left v/m for pt to cb to schedule appt.

## 2016-12-30 ENCOUNTER — Other Ambulatory Visit: Payer: Self-pay | Admitting: Family Medicine

## 2017-02-01 ENCOUNTER — Ambulatory Visit (INDEPENDENT_AMBULATORY_CARE_PROVIDER_SITE_OTHER): Payer: 59 | Admitting: Family Medicine

## 2017-02-01 ENCOUNTER — Encounter: Payer: Self-pay | Admitting: Family Medicine

## 2017-02-01 VITALS — BP 118/60 | HR 89 | Temp 98.5°F | Ht 64.75 in | Wt 270.5 lb

## 2017-02-01 DIAGNOSIS — Z1159 Encounter for screening for other viral diseases: Secondary | ICD-10-CM

## 2017-02-01 DIAGNOSIS — Z0000 Encounter for general adult medical examination without abnormal findings: Secondary | ICD-10-CM | POA: Diagnosis not present

## 2017-02-01 DIAGNOSIS — E784 Other hyperlipidemia: Secondary | ICD-10-CM

## 2017-02-01 DIAGNOSIS — Z79899 Other long term (current) drug therapy: Secondary | ICD-10-CM | POA: Diagnosis not present

## 2017-02-01 DIAGNOSIS — E7849 Other hyperlipidemia: Secondary | ICD-10-CM

## 2017-02-01 DIAGNOSIS — Z125 Encounter for screening for malignant neoplasm of prostate: Secondary | ICD-10-CM | POA: Diagnosis not present

## 2017-02-01 DIAGNOSIS — Z23 Encounter for immunization: Secondary | ICD-10-CM

## 2017-02-01 MED ORDER — LISINOPRIL 20 MG PO TABS: 20.0000 mg | ORAL_TABLET | Freq: Every day | ORAL | 3 refills | Status: DC

## 2017-02-01 MED ORDER — MELOXICAM 15 MG PO TABS: ORAL_TABLET | ORAL | 3 refills | Status: DC

## 2017-02-01 MED ORDER — SIMVASTATIN 40 MG PO TABS: ORAL_TABLET | ORAL | 3 refills | Status: DC

## 2017-02-01 MED ORDER — ESCITALOPRAM OXALATE 10 MG PO TABS: 10.0000 mg | ORAL_TABLET | Freq: Every day | ORAL | 3 refills | Status: DC

## 2017-02-01 NOTE — Progress Notes (Signed)
Dr. Frederico Hamman T. Kelley Polinsky, MD, [facility] [place], [address] Phone: [phone] Fax: [phone]  02/01/2017  Patient: Kevin [name], MRN: [id number], DOB: [birth year], 56 y.o.  Primary Physician:  Owens Loffler, MD   Chief Complaint  Patient presents with  . Annual Exam   Subjective:   Kevin [name] is a 56 y.o. pleasant patient who presents with the following:  Preventative Health Maintenance Visit:  Health Maintenance Summary Reviewed and updated, unless pt declines services.  Tobacco History Reviewed. Still smoking - contemplative Alcohol: No concerns, no excessive use Exercise Habits: rare activity, rec at least 30 mins 5 times a week STD concerns: no risk or activity to increase risk Drug Use: None Encouraged self-testicular check  Health Maintenance  Topic Date Due  . Hepatitis C Screening  09[birth year]  . HIV Screening  11/29/1976  . COLONOSCOPY  12/23/2023  . TETANUS/TDAP  02/02/2027  . INFLUENZA VACCINE  Completed   Immunization History  Administered Date(s) Administered  . Influenza,inj,Quad PF,6+ Mos 01/16/2015, 02/01/2017  . Td 10/23/2005  . Tdap 02/01/2017   Patient Active Problem List   Diagnosis Date Noted  . OSA on CPAP 04/27/2016  . Morbid obesity ([place]) 04/27/2016  . Generalized anxiety disorder 10/01/2014  . 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 Severe obesity (BMI >= 40) ([place]) 08/31/2013  . SLEEP APNEA 06/16/2010  . HYPERCHOLESTEROLEMIA 12/31/2008  . 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 HYPERTENSION 12/31/2008   Past Medical History:  Diagnosis Date  . Hyperlipidemia   . Hypertension   . Obesity    No past surgical history on file. Social History   Social History  . Marital status: Married    Spouse name: Jackelyn Poling  . Number of children: N/A  . 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  [employer]  .  [employer]   (bought out [employer])   Social History Main Topics  . Smoking status: Current Every Day Smoker    Packs/day: 1.00    Years: 15.00    Types: Cigarettes  . Smokeless tobacco: Never Used  . Alcohol use No  . Drug use: No  . Sexual activity: Not on file   Other Topics Concern  . Not on file   Social History Narrative   Former [employer] '86-2009, E7   Caffeine 4-5 cups daily avg.   Married no kids.    [employer], now [facility]   Family History  Problem Relation Age of Onset  . Heart attack Mother   . Coronary artery disease Mother   . COPD Father   . Diabetes Father    No Known Allergies  Medication list has been reviewed and updated.  General: Denies fever, chills, sweats. No significant weight loss. Eyes: Denies blurring,significant itching ENT: Denies earache, sore throat, and hoarseness. Cardiovascular: Denies chest pains, palpitations, dyspnea on exertion Respiratory: Denies cough, dyspnea at rest,wheeezing Breast: no concerns about lumps GI: Denies nausea, vomiting, diarrhea, constipation, change in bowel habits, abdominal pain, melena, hematochezia GU: Denies penile discharge, ED, urinary flow / outflow problems. No STD concerns. Musculoskeletal: INTERMITTENT JOINT PAINS Derm: Denies rash, itching Neuro: Denies  paresthesias, frequent falls, frequent headaches Psych: Denies depression, anxiety Endocrine: Denies cold intolerance, heat intolerance, polydipsia Heme: Denies enlarged lymph nodes Allergy: No hayfever  Objective:   BP 118/60   Pulse 89   Temp 98.5 F (36.9 C) (Oral)   Ht 5'  4.75" (1.645 m)   Wt 270 lb 8 oz (122.7 kg)   BMI 45.36 kg/m  Ideal Body Weight: Weight in (lb) to have BMI = 25: 148.8  No exam data present  GEN: well developed, well nourished, no acute distress Eyes: conjunctiva and lids normal, PERRLA, EOMI ENT: TM clear, nares clear, oral exam WNL Neck: supple, no lymphadenopathy, no thyromegaly, no JVD Pulm: clear to auscultation and percussion,  respiratory effort normal CV: regular rate and rhythm, S1-S2, no murmur, rub or gallop, no bruits, peripheral pulses normal and symmetric, no cyanosis, clubbing, edema or varicosities GI: soft, non-tender; no hepatosplenomegaly, masses; active bowel sounds all quadrants GU: no hernia, testicular mass, penile discharge Lymph: no cervical, axillary or inguinal adenopathy MSK: gait normal, muscle tone and strength WNL, no joint swelling, effusions, discoloration, crepitus  SKIN: clear, good turgor, color WNL, no rashes, lesions, or ulcerations Neuro: normal mental status, normal strength, sensation, and motion Psych: alert; oriented to person, place and time, normally interactive and not anxious or depressed in appearance.  All labs reviewed with patient - current labs pending  Lipids:    Component Value Date/Time   CHOL 147 01/14/2015 1552   TRIG 127.0 01/14/2015 1552   HDL 29.30 (L) 01/14/2015 1552   VLDL 25.4 01/14/2015 1552   CHOLHDL 5 01/14/2015 1552   CBC: CBC Latest Ref Rng & Units 01/14/2015 08/30/2013 06/16/2010  WBC 4.0 - 10.5 K/uL 10.1 10.1 8.4  Hemoglobin 13.0 - 17.0 g/dL 15.7 15.7 15.6  Hematocrit 39.0 - 52.0 % 46.4 46.0 45.4  Platelets 150.0 - 400.0 K/uL 229.0 249.0 694.8    Basic Metabolic Panel:    Component Value Date/Time   NA 141 01/14/2015 1552   K 4.4 01/14/2015 1552   CL 106 01/14/2015 1552   CO2 27 01/14/2015 1552   BUN 15 01/14/2015 1552   CREATININE 0.86 01/14/2015 1552   GLUCOSE 69 (L) 01/14/2015 1552   CALCIUM 9.5 01/14/2015 1552   Hepatic Function Latest Ref Rng & Units 01/14/2015 08/30/2013 06/16/2010  Total Protein 6.0 - 8.3 g/dL 6.9 7.1 6.9  Albumin 3.5 - 5.2 g/dL 4.1 4.1 3.9  AST 0 - 37 U/L 20 23 20   ALT 0 - 53 U/L 33 43 30  Alk Phosphatase 39 - 117 U/L 71 60 62  Total Bilirubin 0.2 - 1.2 mg/dL 0.6 0.6 0.7  Bilirubin, Direct 0.0 - 0.3 mg/dL 0.1 0.1 0.1    No results found for: TSH Lab Results  Component Value Date   PSA 0.44 01/14/2015   PSA  0.71 08/30/2013   PSA 0.46 06/16/2010    Assessment and Plan:   Healthcare maintenance  Need for hepatitis C screening test - Plan: Hepatitis C antibody  Screening PSA (prostate specific antigen) - Plan: PSA  Other hyperlipidemia - Plan: Lipid panel  Encounter for long-term current use of medication - Plan: Basic metabolic panel, CBC with Differential/Platelet, Hepatic function panel  Need for prophylactic vaccination with combined diphtheria-tetanus-pertussis (DTP) vaccine - Plan: Tdap vaccine greater than or equal to 7yo IM  Need for influenza vaccination - Plan: Flu Vaccine QUAD 36+ mos IM   Check labs Refill meds  The patient does smoke cigarettes, and we discussed that tobacco is harmful to one's overall health, and I made the recommendation to quit tobacco products.   Health Maintenance Exam: The patient's preventative maintenance and recommended screening tests for an annual wellness exam were reviewed in full today. Brought up to date unless services declined.  Counselled  on the importance of diet, exercise, and its role in overall health and mortality. The patient's FH and SH was reviewed, including their home life, tobacco status, and drug and alcohol status.  Follow-up in 1 year for physical exam or additional follow-up below.  Follow-up: No Follow-up on file. Or follow-up in 1 year if not noted.  Meds ordered this encounter  Medications  . escitalopram (LEXAPRO) 10 MG tablet    Sig: Take 1 tablet (10 mg total) by mouth daily.    Dispense:  90 tablet    Refill:  3  . lisinopril (PRINIVIL,ZESTRIL) 20 MG tablet    Sig: Take 1 tablet (20 mg total) by mouth daily.    Dispense:  90 tablet    Refill:  3  . simvastatin (ZOCOR) 40 MG tablet    Sig: TAKE 1 TABLET DAILY (NEED OFFICE VISIT)    Dispense:  90 tablet    Refill:  3  . meloxicam (MOBIC) 15 MG tablet    Sig: TK 1 T PO QAM WITH FOOD    Dispense:  90 tablet    Refill:  3   Medications Discontinued  During This Encounter  Medication Reason  . benzonatate (TESSALON) 200 MG capsule Completed Course  . albuterol (PROVENTIL HFA;VENTOLIN HFA) 108 (90 Base) MCG/ACT inhaler Completed Course  . escitalopram (LEXAPRO) 10 MG tablet Reorder  . lisinopril (PRINIVIL,ZESTRIL) 20 MG tablet Reorder  . simvastatin (ZOCOR) 40 MG tablet Reorder  . meloxicam (MOBIC) 15 MG tablet Reorder   Orders Placed This Encounter  Procedures  . Flu Vaccine QUAD 36+ mos IM  . Tdap vaccine greater than or equal to 7yo IM  . Basic metabolic panel  . CBC with Differential/Platelet  . Hepatic function panel  . Lipid panel  . PSA  . Hepatitis C antibody    Signed,  Hillis Mcphatter T. Shakendra Griffeth, MD   Allergies as of 02/01/2017   No Known Allergies     Medication List       Accurate as of 02/01/17 11:59 PM. Always use your most recent med list.          acetaminophen 500 MG tablet Commonly known as:  TYLENOL Take 500 mg by mouth every 6 (six) hours as needed.   aspirin 81 MG tablet Take 81 mg by mouth daily.   escitalopram 10 MG tablet Commonly known as:  LEXAPRO Take 1 tablet (10 mg total) by mouth daily.   lisinopril 20 MG tablet Commonly known as:  PRINIVIL,ZESTRIL Take 1 tablet (20 mg total) by mouth daily.   meloxicam 15 MG tablet Commonly known as:  MOBIC TK 1 T PO QAM WITH FOOD   multivitamin tablet Take 1 tablet by mouth daily.   multivitamin with minerals tablet Take 1 tablet by mouth daily.   simvastatin 40 MG tablet Commonly known as:  ZOCOR TAKE 1 TABLET DAILY (NEED OFFICE VISIT)   vitamin C 1000 MG tablet Take 1,000 mg by mouth daily.            Discharge Care Instructions        Start     Ordered   02/01/17 0000  escitalopram (LEXAPRO) 10 MG tablet  Daily     02/01/17 1457   02/01/17 0000  lisinopril (PRINIVIL,ZESTRIL) 20 MG tablet  Daily     02/01/17 1457   02/01/17 0000  simvastatin (ZOCOR) 40 MG tablet     02/01/17 1457   02/01/17 0000  meloxicam (MOBIC) 15 MG  tablet  02/01/17 1457   02/01/17 4619  Basic metabolic panel     06/15/22 1512   02/01/17 0000  CBC with Differential/Platelet     02/01/17 1512   02/01/17 0000  Hepatic function panel     02/01/17 1512   02/01/17 0000  Lipid panel     02/01/17 1512   02/01/17 0000  PSA     02/01/17 1512   02/01/17 0000  Hepatitis C antibody     02/01/17 1512   02/01/17 0000  Flu Vaccine QUAD 36+ mos IM     02/01/17 1529   02/01/17 0000  Tdap vaccine greater than or equal to 7yo IM     02/01/17 1529

## 2017-02-02 LAB — BASIC METABOLIC PANEL
BUN: 18 mg/dL (ref 6–23)
CO2: 26 mEq/L (ref 19–32)
Calcium: 10 mg/dL (ref 8.4–10.5)
Chloride: 105 mEq/L (ref 96–112)
Creatinine, Ser: 0.94 mg/dL (ref 0.40–1.50)
GFR: 88.5 mL/min (ref >60.00)
Glucose, Bld: 79 mg/dL (ref 70–99)
POTASSIUM: 4.4 meq/L (ref 3.5–5.1)
SODIUM: 140 meq/L (ref 135–145)

## 2017-02-02 LAB — CBC WITH DIFFERENTIAL/PLATELET
Basophils Absolute: 0.1 10*3/uL (ref 0.0–0.1)
Basophils Relative: 0.7 % (ref 0.0–3.0)
EOS PCT: 3.1 % (ref 0.0–5.0)
Eosinophils Absolute: 0.3 10*3/uL (ref 0.0–0.7)
HCT: 44.7 % (ref 39.0–52.0)
HEMOGLOBIN: 15.1 g/dL (ref 13.0–17.0)
Lymphocytes Relative: 24.9 % (ref 12.0–46.0)
Lymphs Abs: 2.5 10*3/uL (ref 0.7–4.0)
MCHC: 33.7 g/dL (ref 30.0–36.0)
MCV: 91.8 fl (ref 78.0–100.0)
MONO ABS: 0.9 10*3/uL (ref 0.1–1.0)
MONOS PCT: 8.7 % (ref 3.0–12.0)
Neutro Abs: 6.4 10*3/uL (ref 1.4–7.7)
Neutrophils Relative %: 62.6 % (ref 43.0–77.0)
Platelets: 266 10*3/uL (ref 150.0–400.0)
RBC: 4.87 Mil/uL (ref 4.22–5.81)
RDW: 13.8 % (ref 11.5–15.5)
WBC: 10.2 10*3/uL (ref 4.0–10.5)

## 2017-02-02 LAB — HEPATIC FUNCTION PANEL
ALK PHOS: 62 U/L (ref 39–117)
ALT: 27 U/L (ref 0–53)
AST: 18 U/L (ref 0–37)
Albumin: 4.4 g/dL (ref 3.5–5.2)
BILIRUBIN TOTAL: 0.5 mg/dL (ref 0.2–1.2)
Bilirubin, Direct: 0.1 mg/dL (ref 0.0–0.3)
Total Protein: 7.1 g/dL (ref 6.0–8.3)

## 2017-02-02 LAB — LIPID PANEL
CHOL/HDL RATIO: 8
CHOLESTEROL: 234 mg/dL — AB (ref 0–200)
HDL: 29.2 mg/dL — ABNORMAL LOW (ref >39.00)
NonHDL: 204.6
Triglycerides: 300 mg/dL — ABNORMAL HIGH (ref 0.0–149.0)
VLDL: 60 mg/dL — ABNORMAL HIGH (ref 0.0–40.0)

## 2017-02-02 LAB — PSA: PSA: 0.76 ng/mL (ref 0.10–4.00)

## 2017-02-02 LAB — HEPATITIS C ANTIBODY
HEP C AB: NONREACTIVE
SIGNAL TO CUT-OFF: 0.02 (ref <1.00)

## 2017-02-02 LAB — LDL CHOLESTEROL, DIRECT: Direct LDL: 146 mg/dL

## 2017-02-03 ENCOUNTER — Encounter: Payer: Self-pay | Admitting: *Deleted

## 2017-03-06 ENCOUNTER — Other Ambulatory Visit: Payer: Self-pay | Admitting: Family Medicine

## 2017-04-28 ENCOUNTER — Ambulatory Visit: Payer: BLUE CROSS/BLUE SHIELD | Admitting: Neurology

## 2017-07-26 ENCOUNTER — Encounter: Payer: Self-pay | Admitting: Neurology

## 2017-07-26 ENCOUNTER — Ambulatory Visit (INDEPENDENT_AMBULATORY_CARE_PROVIDER_SITE_OTHER): Payer: BLUE CROSS/BLUE SHIELD | Admitting: Neurology

## 2017-07-26 VITALS — BP 130/82 | HR 84 | Ht 64.0 in | Wt 283.0 lb

## 2017-07-26 DIAGNOSIS — G4733 Obstructive sleep apnea (adult) (pediatric): Secondary | ICD-10-CM

## 2017-07-26 DIAGNOSIS — Z9989 Dependence on other enabling machines and devices: Secondary | ICD-10-CM | POA: Diagnosis not present
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
## 2017-07-26 NOTE — Patient Instructions (Signed)
BRING YOUR CPAP with mask, hose and cable.

## 2017-08-03 ENCOUNTER — Encounter: Payer: Self-pay | Admitting: Neurology

## 2017-12-30 ENCOUNTER — Other Ambulatory Visit: Payer: Self-pay | Admitting: Family Medicine

## 2017-12-30 ENCOUNTER — Encounter: Payer: Self-pay | Admitting: Internal Medicine

## 2017-12-30 ENCOUNTER — Ambulatory Visit (INDEPENDENT_AMBULATORY_CARE_PROVIDER_SITE_OTHER): Payer: BLUE CROSS/BLUE SHIELD | Admitting: Internal Medicine

## 2017-12-30 VITALS — BP 132/76 | HR 70 | Temp 97.9°F | Wt 286.0 lb

## 2017-12-30 DIAGNOSIS — M79605 Pain in left leg: Secondary | ICD-10-CM

## 2017-12-30 DIAGNOSIS — M25552 Pain in left hip: Secondary | ICD-10-CM | POA: Diagnosis not present

## 2017-12-30 MED ORDER — PREDNISONE 10 MG PO TABS: ORAL_TABLET | ORAL | 0 refills | Status: DC

## 2017-12-30 MED ORDER — ORPHENADRINE CITRATE ER 100 MG PO TB12: 100.0000 mg | ORAL_TABLET | Freq: Two times a day (BID) | ORAL | 0 refills | Status: DC

## 2017-12-30 NOTE — Telephone Encounter (Signed)
Last office visit today with R. Baity for Left hip pain.  Last refilled 02/01/2017 for #90 with 3 refills.  Ok to refill?

## 2017-12-30 NOTE — Progress Notes (Signed)
Subjective:    Patient ID: Kevin [name], male    DOB: [birth year], 57 y.o.   MRN: [id number]  HPI  Pt presents to the clinic today with c/o left hip pain. This started 2 weeks ago. He describes the pain as achy and dull. The pain radiates into the left leg. He has some numbness and weakness but no tingling. He denies any issues with bowel and bladder. He denies any injury to the area. He has tried Tylenol, Ibuprofen and BC Powder with minimal relief.  Review of Systems      Past Medical History:  Diagnosis Date  . Hyperlipidemia   . Hypertension   . Obesity     Current Outpatient Medications  Medication Sig Dispense Refill  . acetaminophen (TYLENOL) 500 MG tablet Take 500 mg by mouth every 6 (six) hours as needed.    . Ascorbic Acid (VITAMIN C) 1000 MG tablet Take 1,000 mg by mouth daily.    Marland Kitchen aspirin 81 MG tablet Take 81 mg by mouth daily.      Marland Kitchen escitalopram (LEXAPRO) 10 MG tablet Take 1 tablet (10 mg total) by mouth daily. 90 tablet 3  . lisinopril (PRINIVIL,ZESTRIL) 20 MG tablet Take 1 tablet (20 mg total) by mouth daily. 90 tablet 3  . meloxicam (MOBIC) 15 MG tablet TK 1 T PO QAM WITH FOOD 90 tablet 3  . Multiple Vitamin (MULTIVITAMIN) tablet Take 1 tablet by mouth daily.    . Multiple Vitamins-Minerals (MULTIVITAMIN WITH MINERALS) tablet Take 1 tablet by mouth daily.    . simvastatin (ZOCOR) 40 MG tablet TAKE 1 TABLET DAILY (NEED OFFICE VISIT) 90 tablet 3   No current facility-administered medications for this visit.     No Known Allergies  Family History  Problem Relation Age of Onset  . Heart attack Mother   . Coronary artery disease Mother   . COPD Father   . Diabetes Father     Social History   Socioeconomic History  . Marital status: Married    Spouse name: Eunice Blase  . Number of children: Not on file  . Years of education: Not on file  . Highest education level: Not on file  Occupational History  . Occupation: MILITARY    Employer: [employer]    Employer: [employer]    Employer: [employer]    Comment: (bought out [employer])  Social Needs  . Financial resource strain: Not on file  . Food insecurity:    Worry: Not on file    Inability: Not on file  . Transportation needs:    Medical: Not on file    Non-medical: Not on file  Tobacco Use  . Smoking status: Current Every Day Smoker    Packs/day: 1.00    Years: 15.00    Pack years: 15.00    Types: Cigarettes  . Smokeless tobacco: Never Used  Substance and Sexual Activity  . Alcohol use: No    Alcohol/week: 0.0 standard drinks  . Drug use: No  . Sexual activity: Not on file  Lifestyle  . Physical activity:    Days per week: Not on file    Minutes per session: Not on file  . Stress: Not on file  Relationships  . Social connections:    Talks on phone: Not on file    Gets together: Not on file    Attends religious service: Not on file    Active member of club or organization: Not on file    Attends  meetings of clubs or organizations: Not on file    Relationship status: Not on file  . Intimate partner violence:    Fear of current or ex partner: Not on file    Emotionally abused: Not on file    Physically abused: Not on file    Forced sexual activity: Not on file  Other Topics Concern  . Not on file  Social History Narrative   Former [employer]-2009, E7   Caffeine 4-5 cups daily avg.   Married no kids.    [place], now [place]     Constitutional: Denies fever, malaise, fatigue, headache or abrupt weight changes.  Gastrointestinal: Denies abdominal pain, bloating, constipation, diarrhea or blood in the stool.  GU: Denies urgency, frequency, pain with urination, burning sensation, blood in urine, odor or discharge. Musculoskeletal: Pt reports left hip and leg pain. Denies decrease in range of motion,  muscle pain or joint swelling.  Neurological: Pt reports numbness in left leg. Denies dizziness, difficulty with memory, difficulty with speech or problems with balance and  coordination.    No other specific complaints in a complete review of systems (except as listed in HPI above).  Objective:   Physical Exam  BP 132/76   Pulse 70   Temp 97.9 F (36.6 C) (Oral)   Wt 286 lb (129.7 kg)   SpO2 97%   BMI 49.09 kg/m  Wt Readings from Last 3 Encounters:  12/30/17 286 lb (129.7 kg)  07/26/17 283 lb (128.4 kg)  02/01/17 270 lb 8 oz (122.7 kg)    General: Appears heisstated age, obese in NAD. Musculoskeletal: Decreased flexion and extension of the spine. Normal rotation. No bony tendernes s noted over the spine. Normal flexion, extension, abduction and adduction of the left hip. Pain with internal and external rotation of the left hip. Pain with palpation over the left SI joint, left trochanteric bursa. Strength 4/5 LLE, 5/5 RLE. Not able to walk on toes but can walk on heels. Limps with normal gait. Neurological: Alert and oriented. Positive SLR on the left.   BMET    Component Value Date/Time   NA 140 02/01/2017 1524   K 4.4 02/01/2017 1524   CL 105 02/01/2017 1524   CO2 26 02/01/2017 1524   GLUCOSE 79 02/01/2017 1524   BUN 18 02/01/2017 1524   CREATININE 0.94 02/01/2017 1524   CALCIUM 10.0 02/01/2017 1524   GFRNONAA 110.09 12/31/2008 0936    Lipid Panel     Component Value Date/Time   CHOL 234 (H) 02/01/2017 1524   TRIG 300.0 (H) 02/01/2017 1524   HDL 29.20 (L) 02/01/2017 1524   CHOLHDL 8 02/01/2017 1524   VLDL 60.0 (H) 02/01/2017 1524   LDLCALC 92 01/14/2015 1552    CBC    Component Value Date/Time   WBC 10.2 02/01/2017 1524   RBC 4.87 02/01/2017 1524   HGB 15.1 02/01/2017 1524   HCT 44.7 02/01/2017 1524   PLT 266.0 02/01/2017 1524   MCV 91.8 02/01/2017 1524   MCHC 33.7 02/01/2017 1524   RDW 13.8 02/01/2017 1524   LYMPHSABS 2.5 02/01/2017 1524   MONOABS 0.9 02/01/2017 1524   EOSABS 0.3 02/01/2017 1524   BASOSABS 0.1 02/01/2017 1524    Hgb A1C No results found for: HGBA1C         Assessment & Plan:   Left Hip  Pain, Left Leg pain:  DDX SI joint arthritis, trochanteric bursitis. Sciatica eRx for Pred Taper x 9 days eRx for Norflex 100 mg BID-  sedation caution given Heat may be helpful Stretching exercises given If worse, consider xray lumbar spine, left hip  Return precautions discussed Nicki Reaperegina Baity, NP

## 2017-12-30 NOTE — Patient Instructions (Signed)

## 2018-01-04 ENCOUNTER — Encounter: Payer: Self-pay | Admitting: *Deleted

## 2018-01-05 ENCOUNTER — Ambulatory Visit (INDEPENDENT_AMBULATORY_CARE_PROVIDER_SITE_OTHER)
Admission: RE | Admit: 2018-01-05 | Discharge: 2018-01-05 | Disposition: A | Discharge status: Home / Self Care | Payer: BLUE CROSS/BLUE SHIELD | Source: Ambulatory Visit | Attending: Family Medicine | Admitting: Family Medicine

## 2018-01-05 ENCOUNTER — Encounter: Payer: Self-pay | Admitting: Family Medicine

## 2018-01-05 ENCOUNTER — Ambulatory Visit (INDEPENDENT_AMBULATORY_CARE_PROVIDER_SITE_OTHER): Payer: BLUE CROSS/BLUE SHIELD | Admitting: Family Medicine

## 2018-01-05 VITALS — BP 140/80 | HR 95 | Temp 97.7°F | Ht 64.75 in | Wt 285.5 lb

## 2018-01-05 DIAGNOSIS — R208 Other disturbances of skin sensation: Secondary | ICD-10-CM

## 2018-01-05 DIAGNOSIS — M5416 Radiculopathy, lumbar region: Secondary | ICD-10-CM

## 2018-01-05 DIAGNOSIS — T50905A Adverse effect of unspecified drugs, medicaments and biological substances, initial encounter: Secondary | ICD-10-CM | POA: Diagnosis not present

## 2018-01-05 MED ORDER — PREDNISONE 20 MG PO TABS: ORAL_TABLET | ORAL | 0 refills | Status: DC

## 2018-01-05 MED ORDER — AMITRIPTYLINE HCL 25 MG PO TABS: 25.0000 mg | ORAL_TABLET | Freq: Every day | ORAL | 1 refills | Status: DC

## 2018-01-05 NOTE — Progress Notes (Signed)
Dr. Karleen Hampshire T. Biagio Snelson, MD, CAQ [facility] [address] [place], [address] Phone: [phone] Fax: [phone]  01/05/2018  Patient: Kevin [name], MRN: [id number], DOB: [birth year], 57 y.o.  Primary Physician:  Hannah Beat, MD   Chief Complaint  Patient presents with  . Medication Problem    Norflex  . Hip Injury    Left radiates down leg x 2 weeks-seen R. Baity last Thursday   Subjective:   Kevin [name] is a 57 y.o. very pleasant male patient who presents with the following:  3 weeks, pain from LBP all the way from back to the foot and numbness on the left. Took some prednisone. 30, 20, 10 - 9 d total.  He is here accompanied by his wife who provides additional history.  They are both known well.  He describes pain in the posterior buttocks region on the left as well as in the back radiates down from the left in the back and buttocks region all the way down his left leg.  He tried to go back to work on Monday, and he was in relatively unbearable pain and had a difficult time ambulating.  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  He does not have any bowel or bladder incontinence, and he also does not have any saddle anesthesia.  He has had some intermittent problems with his back off and on over the years, but he is never had any operative interventions.  Norflex did cause some tremors, and this was stopped and promptly resolved.  Went back to work on Monday, too much the other day. Fairly unbearable pain.   Numbness lateral LE and thigh Norflex caused some shaking.   Past Medical History, Surgical History, Social History, Family History, Problem List, Medications, and Allergies have been reviewed and updated if relevant.  Patient Active Problem List   Diagnosis Date Noted  . OSA on CPAP 04/27/2016  . Morbid obesity  (HCC) 04/27/2016  . Generalized anxiety disorder 10/01/2014  . 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 Severe obesity (BMI >= 40) (HCC) 08/31/2013  . SLEEP APNEA 06/16/2010  . HYPERCHOLESTEROLEMIA 12/31/2008  . 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 HYPERTENSION 12/31/2008    Past Medical History:  Diagnosis Date  . Hyperlipidemia   . Hypertension   . Obesity     History reviewed. No pertinent surgical history.  Social History   Socioeconomic History  . Marital status: Married    Spouse name: Eunice Blase  . Number of children: Not on file  . Years of education: Not on file  . Highest education level: Not on file  Occupational History  . Occupation: MILITARY    Employer: [employer]    Employer: [employer]    Employer: [employer]    Comment: (bought out [employer])  Social Needs  . Financial resource strain: Not on file  . Food insecurity:    Worry: Not on file    Inability: Not on file  . Transportation needs:    Medical: Not on file    Non-medical: Not on file  Tobacco Use  . Smoking status: Current Every Day Smoker    Packs/day: 1.00    Years: 15.00    Pack years: 15.00    Types: Cigarettes  . Smokeless tobacco: Never Used  Substance and Sexual Activity  . Alcohol use: No  Alcohol/week: 0.0 standard drinks  . Drug use: No  . Sexual activity: Not on file  Lifestyle  . Physical activity:    Days per week: Not on file    Minutes per session: Not on file  . Stress: Not on file  Relationships  . Social connections:    Talks on phone: Not on file    Gets together: Not on file    Attends religious service: Not on file    Active member of club or organization: Not on file    Attends meetings of clubs or organizations: Not on file    Relationship status: Not on file  . Intimate partner violence:    Fear of current or ex partner: Not on file    Emotionally abused: Not on file    Physically abused: Not on file    Forced sexual activity: Not on file  Other  Topics Concern  . Not on file  Social History Narrative   Former [employer]-2009, E7   Caffeine 4-5 cups daily avg.   Married no kids.    [employer]    Family History  Problem Relation Age of Onset  . Heart attack Mother   . Coronary artery disease Mother   . COPD Father   . Diabetes Father     No Known Allergies  Medication list reviewed and updated in full in [facility].  GEN: No fevers, chills. Nontoxic. Primarily MSK c/o today. MSK: Detailed in the HPI GI: tolerating PO intake without difficulty Neuro: No numbness, parasthesias, or tingling associated. Otherwise the pertinent positives of the ROS are noted above.   Objective:   BP 140/80   Pulse 95   Temp 97.7 F (36.5 C) (Oral)   Ht 5' 4.75" (1.645 m)   Wt 285 lb 8 oz (129.5 kg)   BMI 47.88 kg/m    GEN: Well-developed,well-nourished,in no acute distress; alert,appropriate and cooperative throughout examination HEENT: Normocephalic and atraumatic without obvious abnormalities. Ears, externally no deformities PULM: Breathing comfortably in no respiratory distress EXT: No clubbing, cyanosis, or edema PSYCH: Normally interactive. Cooperative during the interview. Pleasant. Friendly and conversant. Not anxious or depressed appearing. Normal, full affect.  Range of motion at  the waist: Flexion, extension, lateral bending and rotation: He is minimally able to forward flex at the waist.  Extension is also limited.  Loss of approximately 50 to 70% with lateral bending and rotational movements as well.  No echymosis or edema Rises to examination table with mild difficulty Gait: minimally antalgic  Inspection/Deformity: N Paraspinus Tenderness: Diffuse from L2-S1 bilaterally.  B Ankle Dorsiflexion (L5,4): 5/5 B Great Toe Dorsiflexion (L5,4): 5/5 Heel Walk (L5): WNL Toe Walk (S1): WNL Rise/Squat (L4): WNL, mild pain  SENSORY B Medial Foot (L4): WNL B Dorsum (L5): WNL B Lateral (S1): WNL Light Touch:  Decreased on the lateral aspect of the thigh as well as the lateral aspect of the lower extremity on the left Pinprick: Decreased on the lateral aspect of the thigh as well as lateral aspect of the lower extremity on the left.  REFLEXES Knee (L4): 2+ Ankle (S1): 2+  B SLR, seated: + B SLR, supine: + B FABER: neg B Reverse FABER: neg B Greater Troch: NT B Log Roll: neg B Stork: NT B Sciatic Notch: TTP B  Radiology: Dg Lumbar Spine Complete  Result Date: 01/06/2018 CLINICAL DATA:  Lumbar radiculopathy. EXAM: LUMBAR SPINE - COMPLETE 4+ VIEW COMPARISON:  No prior. FINDINGS: Calcified pelvic densities consistent  phleboliths noted. Lumbar spine numbered with the lowest segmented lumbar shaped vertebrae on lateral view as L5. Diffuse multilevel degenerative change lumbar spine. Minimal anterior wedging of L1 and L4 noted. This may be old. No prominent compression fracture noted. IMPRESSION: 1.  Diffuse multilevel degenerative change lumbar spine. 2. Minimal anterior wedging of L1 and L4 noted. This may be old. No prominent compression fracture noted. Electronically Signed   By: Maisie Fushomas  Register   On: 01/06/2018 06:03    Assessment and Plan:   Acute left lumbar radiculopathy - Plan: DG Lumbar Spine Complete  Medication side effect, initial encounter  Decreased sensation of lower extremity  Multilevel degenerative findings on x-ray from a degenerative disc disease as well as spondyloarthropathy standpoint combined with lateral thigh and lateral lower extremity decreased sensation suggests nerve compromise, foraminal alone versus discogenic and combined degenerative encroachment.  For now, we will treat him aggressively with higher dose steroids x10 days, and supportively with some try cyclic antidepressants to hopefully help with some neuropathic pain.  Follow-up closely for resolution of symptoms and if needed takes next steps in radicular back algorithm.  Follow-up: Return in about 3  weeks (around 01/26/2018).  Meds ordered this encounter  Medications  . predniSONE (DELTASONE) 20 MG tablet    Sig: 2 tabs po daily for 5 days, then 1 tab po daily for 5 days    Dispense:  15 tablet    Refill:  0  . amitriptyline (ELAVIL) 25 MG tablet    Sig: Take 1 tablet (25 mg total) by mouth at bedtime.    Dispense:  30 tablet    Refill:  1   Orders Placed This Encounter  Procedures  . DG Lumbar Spine Complete    Signed,  Christien Frankl T. Mykayla Brinton, MD   Allergies as of 01/05/2018   No Known Allergies     Medication List        Accurate as of 01/05/18 11:59 PM. Always use your most recent med list.          acetaminophen 500 MG tablet Commonly known as:  TYLENOL Take 500 mg by mouth every 6 (six) hours as needed.   amitriptyline 25 MG tablet Commonly known as:  ELAVIL Take 1 tablet (25 mg total) by mouth at bedtime.   aspirin 81 MG tablet Take 81 mg by mouth daily.   escitalopram 10 MG tablet Commonly known as:  LEXAPRO Take 1 tablet (10 mg total) by mouth daily.   lisinopril 20 MG tablet Commonly known as:  PRINIVIL,ZESTRIL TAKE 1 TABLET(20 MG) BY MOUTH DAILY   meloxicam 15 MG tablet Commonly known as:  MOBIC TAKE 1 TABLET BY MOUTH EVERY MORNING WITH FOOD   multivitamin tablet Take 1 tablet by mouth daily.   multivitamin with minerals tablet Take 1 tablet by mouth daily.   predniSONE 20 MG tablet Commonly known as:  DELTASONE 2 tabs po daily for 5 days, then 1 tab po daily for 5 days   simvastatin 40 MG tablet Commonly known as:  ZOCOR TAKE 1 TABLET BY MOUTH DAILY   vitamin C 1000 MG tablet Take 1,000 mg by mouth daily.

## 2018-01-12 ENCOUNTER — Telehealth: Payer: Self-pay | Admitting: Family Medicine

## 2018-01-12 NOTE — Telephone Encounter (Signed)
Spouse dropped off fmla paperwork In dr copland's in box

## 2018-01-21 NOTE — Telephone Encounter (Signed)
Spouse aware paperwork has been faxed Copy for pt Copy for scan

## 2018-01-21 NOTE — Telephone Encounter (Signed)
Paperwork faxed °

## 2018-01-25 NOTE — Telephone Encounter (Signed)
Pt dropped off a new packet of FMLA papers to be filled out. Pt states the prior packet was the wrong packet. I filled in based on ppw previously filled out and placed all ppw in your in box to be filled out.

## 2018-01-28 NOTE — Telephone Encounter (Signed)
I received ppw back signed by Dr Patsy Lager and spoke with pt to advise. He gave me permission to fax to [facility] and I have left a copy up front for pick up.

## 2018-02-01 ENCOUNTER — Other Ambulatory Visit: Payer: Self-pay | Admitting: Family Medicine

## 2018-02-01 DIAGNOSIS — Z0000 Encounter for general adult medical examination without abnormal findings: Secondary | ICD-10-CM

## 2018-02-02 ENCOUNTER — Other Ambulatory Visit (INDEPENDENT_AMBULATORY_CARE_PROVIDER_SITE_OTHER): Payer: BLUE CROSS/BLUE SHIELD

## 2018-02-02 DIAGNOSIS — Z0000 Encounter for general adult medical examination without abnormal findings: Secondary | ICD-10-CM | POA: Diagnosis not present

## 2018-02-03 LAB — CBC WITH DIFFERENTIAL/PLATELET
Basophils Absolute: 0.1 10*3/uL (ref 0.0–0.1)
Basophils Relative: 1 % (ref 0.0–3.0)
Eosinophils Absolute: 0.3 10*3/uL (ref 0.0–0.7)
Eosinophils Relative: 3.4 % (ref 0.0–5.0)
HCT: 41 % (ref 39.0–52.0)
Hemoglobin: 14.3 g/dL (ref 13.0–17.0)
LYMPHS PCT: 26.3 % (ref 12.0–46.0)
Lymphs Abs: 2.4 10*3/uL (ref 0.7–4.0)
MCHC: 34.8 g/dL (ref 30.0–36.0)
MCV: 90 fl (ref 78.0–100.0)
MONO ABS: 0.9 10*3/uL (ref 0.1–1.0)
MONOS PCT: 9.9 % (ref 3.0–12.0)
Neutro Abs: 5.5 10*3/uL (ref 1.4–7.7)
Neutrophils Relative %: 59.4 % (ref 43.0–77.0)
PLATELETS: 246 10*3/uL (ref 150.0–400.0)
RBC: 4.56 Mil/uL (ref 4.22–5.81)
RDW: 14 % (ref 11.5–15.5)
WBC: 9.3 10*3/uL (ref 4.0–10.5)

## 2018-02-03 LAB — HEPATIC FUNCTION PANEL
ALT: 34 U/L (ref 0–53)
AST: 19 U/L (ref 0–37)
Albumin: 4.1 g/dL (ref 3.5–5.2)
Alkaline Phosphatase: 59 U/L (ref 39–117)
Bilirubin, Direct: 0.1 mg/dL (ref 0.0–0.3)
Total Bilirubin: 0.4 mg/dL (ref 0.2–1.2)
Total Protein: 6.6 g/dL (ref 6.0–8.3)

## 2018-02-03 LAB — LIPID PANEL
CHOLESTEROL: 137 mg/dL (ref 0–200)
HDL: 23.8 mg/dL — ABNORMAL LOW (ref >39.00)
NonHDL: 113.36
Total CHOL/HDL Ratio: 6
Triglycerides: 351 mg/dL — ABNORMAL HIGH (ref 0.0–149.0)
VLDL: 70.2 mg/dL — ABNORMAL HIGH (ref 0.0–40.0)

## 2018-02-03 LAB — PSA: PSA: 0.52 ng/mL (ref 0.10–4.00)

## 2018-02-03 LAB — BASIC METABOLIC PANEL
BUN: 19 mg/dL (ref 6–23)
CO2: 26 mEq/L (ref 19–32)
Calcium: 9.5 mg/dL (ref 8.4–10.5)
Chloride: 106 mEq/L (ref 96–112)
Creatinine, Ser: 0.99 mg/dL (ref 0.40–1.50)
GFR: 83.06 mL/min (ref >60.00)
Glucose, Bld: 72 mg/dL (ref 70–99)
Potassium: 4.3 mEq/L (ref 3.5–5.1)
Sodium: 139 mEq/L (ref 135–145)

## 2018-02-03 LAB — LDL CHOLESTEROL, DIRECT: LDL DIRECT: 85 mg/dL

## 2018-02-07 NOTE — Telephone Encounter (Signed)
Pts wife calling and states that page3 section 4A and 4B of the FMLA paperwork was not filled out correctly. Needs a signature on one page with the YES box checked, and then the dates and frequency he will be out of work. Please call when fixed and re-faxed.

## 2018-02-07 NOTE — Telephone Encounter (Signed)
Spoke with Salmia  @[email or web address] . She stated if pt only needs time out in aug paperwork is correct and pt needs to call them.   Spoke to pt he wanted aug time off and intermittent for a year just in case he has problems again.  Pt has appointment with dr copland on 9/19 recommend he talk to dr copland at that appointment regarding leave.  Pt is aware he needs to contact [employer] for time request off in aug. Dr copland is aware what pt is requesting and will talk to him at his appointment

## 2018-02-08 ENCOUNTER — Encounter: Payer: Self-pay | Admitting: Family Medicine

## 2018-02-08 NOTE — Progress Notes (Signed)
Dr. Frederico Hamman T. Bertin Inabinet, MD, [facility] [place], [address] Phone: [phone] Fax: [phone]  02/09/2018  Patient: Kevin [name], MRN: [id number], DOB: 1961/01/25, 57 y.o.  Primary Physician:  Owens Loffler, MD   Chief Complaint  Patient presents with  . Annual Exam   Subjective:   Kevin [name] is a 57 y.o. pleasant patient who presents with the following:  Preventative Health Maintenance Visit:  Health Maintenance Summary Reviewed and updated, unless pt declines services.  Tobacco History Reviewed. Alcohol: No concerns, no excessive use Exercise Habits: walking a lot at work only STD concerns: no risk or activity to increase risk Drug Use: None Encouraged self-testicular check  Numbness on L leg is resolved.  Back pain is quite a bit better.   Leftovers for lunch.  Dinner is meats and veggies. Cut down on the sweets.   Health Maintenance  Topic Date Due  . HIV Screening  11/29/1976  . COLONOSCOPY  12/23/2023  . TETANUS/TDAP  02/02/2027  . INFLUENZA VACCINE  Completed  . Hepatitis C Screening  Completed   Immunization History  Administered Date(s) Administered  . Influenza,inj,Quad PF,6+ Mos 01/16/2015, 02/01/2017, 02/09/2018  . Td 10/23/2005  . Tdap 02/01/2017   Patient Active Problem List   Diagnosis Date Noted  . OSA on CPAP 04/27/2016  . Morbid obesity ([place]) 04/27/2016  . Generalized anxiety disorder 10/01/2014  . 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 Severe obesity (BMI >= 40) ([place]) 08/31/2013  . SLEEP APNEA 06/16/2010  . HYPERCHOLESTEROLEMIA 12/31/2008  . 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 HYPERTENSION 12/31/2008   Past Medical History:  Diagnosis Date  . Hyperlipidemia   . Hypertension   . Obesity    History reviewed. No pertinent surgical history. Social History   Socioeconomic History  . Marital status: Married    Spouse name: Jackelyn Poling  . Number  of children: Not on file  . Years of education: Not on file  . Highest education level: Not on file  Occupational History  . Occupation: MILITARY    Employer: [employer]    Employer: [employer]    Employer: [place]: (bought out [employer])  Social Needs  . Financial resource strain: Not on file  . Food insecurity:    Worry: Not on file    Inability: Not on file  . Transportation needs:    Medical: Not on file    Non-medical: Not on file  Tobacco Use  . Smoking status: Current Every Day Smoker    Packs/day: 1.00    Years: 15.00    Pack years: 15.00    Types: Cigarettes  . Smokeless tobacco: Never Used  Substance and Sexual Activity  . Alcohol use: No    Alcohol/week: 0.0 standard drinks  . Drug use: No  . Sexual activity: Not on file  Lifestyle  . Physical activity:    Days per week: Not on file    Minutes per session: Not on file  . Stress: Not on file  Relationships  . Social connections:    Talks on phone: Not on file    Gets together: Not on file    Attends religious service: Not on file    Active member of club or organization: Not on file    Attends meetings of clubs or organizations: Not on file    Relationship status: Not on file  . Intimate partner violence:  Fear of current or ex partner: Not on file    Emotionally abused: Not on file    Physically abused: Not on file    Forced sexual activity: Not on file  Other Topics Concern  . Not on file  Social History Narrative   Former [employer] '86-2009, E7   Caffeine 4-5 cups daily avg.   Married no kids.    [place], now [facility]   Family History  Problem Relation Age of Onset  . Heart attack Mother   . Coronary artery disease Mother   . COPD Father   . Diabetes Father    No Known Allergies  Medication list has been reviewed and updated.   General: Denies fever, chills, sweats. No significant weight loss. Eyes: Denies blurring,significant itching ENT: Denies earache, sore throat, and  hoarseness. Cardiovascular: Denies chest pains, palpitations, dyspnea on exertion Respiratory: Denies cough, dyspnea at rest,wheeezing Breast: no concerns about lumps GI: Denies nausea, vomiting, diarrhea, constipation, change in bowel habits, abdominal pain, melena, hematochezia GU: Denies penile discharge, ED, urinary flow / outflow problems. No STD concerns. Musculoskeletal: Denies back pain, joint pain Derm: Denies rash, itching Neuro: Denies  paresthesias, frequent falls, frequent headaches Psych: Denies depression, anxiety Endocrine: Denies cold intolerance, heat intolerance, polydipsia Heme: Denies enlarged lymph nodes Allergy: No hayfever  Objective:   BP 120/80   Pulse 75   Temp 97.7 F (36.5 C) (Oral)   Ht 5' 4.5" (1.638 m)   Wt 286 lb 8 oz (130 kg)   BMI 48.42 kg/m  Ideal Body Weight: Weight in (lb) to have BMI = 25: 147.6  No exam data present  GEN: well developed, well nourished, no acute distress Eyes: conjunctiva and lids normal, PERRLA, EOMI ENT: TM clear, nares clear, oral exam WNL Neck: supple, no lymphadenopathy, no thyromegaly, no JVD Pulm: clear to auscultation and percussion, respiratory effort normal CV: regular rate and rhythm, S1-S2, no murmur, rub or gallop, no bruits, peripheral pulses normal and symmetric, no cyanosis, clubbing, edema or varicosities GI: soft, non-tender; no hepatosplenomegaly, masses; active bowel sounds all quadrants GU: no hernia, testicular mass, penile discharge Lymph: no cervical, axillary or inguinal adenopathy MSK: gait normal, muscle tone and strength WNL, no joint swelling, effusions, discoloration, crepitus  SKIN: clear, good turgor, color WNL, no rashes, lesions, or ulcerations Neuro: normal mental status, normal strength, sensation, and motion Psych: alert; oriented to person, place and time, normally interactive and not anxious or depressed in appearance. All labs reviewed with patient.  Lipids:    Component Value  Date/Time   CHOL 137 02/02/2018 1448   TRIG 351.0 (H) 02/02/2018 1448   HDL 23.80 (L) 02/02/2018 1448   LDLDIRECT 85.0 02/02/2018 1448   VLDL 70.2 (H) 02/02/2018 1448   CHOLHDL 6 02/02/2018 1448   CBC: CBC Latest Ref Rng & Units 02/02/2018 02/01/2017 01/14/2015  WBC 4.0 - 10.5 K/uL 9.3 10.2 10.1  Hemoglobin 13.0 - 17.0 g/dL 14.3 15.1 15.7  Hematocrit 39.0 - 52.0 % 41.0 44.7 46.4  Platelets 150.0 - 400.0 K/uL 246.0 266.0 093.2    Basic Metabolic Panel:    Component Value Date/Time   NA 139 02/02/2018 1448   K 4.3 02/02/2018 1448   CL 106 02/02/2018 1448   CO2 26 02/02/2018 1448   BUN 19 02/02/2018 1448   CREATININE 0.99 02/02/2018 1448   GLUCOSE 72 02/02/2018 1448   CALCIUM 9.5 02/02/2018 1448   Hepatic Function Latest Ref Rng & Units 02/02/2018 02/01/2017 01/14/2015  Total Protein 6.0 -  8.3 g/dL 6.6 7.1 6.9  Albumin 3.5 - 5.2 g/dL 4.1 4.4 4.1  AST 0 - 37 U/L 19 18 20   ALT 0 - 53 U/L 34 27 33  Alk Phosphatase 39 - 117 U/L 59 62 71  Total Bilirubin 0.2 - 1.2 mg/dL 0.4 0.5 0.6  Bilirubin, Direct 0.0 - 0.3 mg/dL 0.1 0.1 0.1    No results found for: TSH Lab Results  Component Value Date   PSA 0.52 02/02/2018   PSA 0.76 02/01/2017   PSA 0.44 01/14/2015    Assessment and Plan:   Healthcare maintenance  Need for prophylactic vaccination and inoculation against influenza - Plan: Flu Vaccine QUAD 6+ mos PF IM (Fluarix Quad PF)  Change to crestor  He is asymptomatic, no prior spine surgery and typically does not have back problems. I do not feel comfortable completing intermittent FMLA in this case.   Health Maintenance Exam: The patient's preventative maintenance and recommended screening tests for an annual wellness exam were reviewed in full today. Brought up to date unless services declined.  Counselled on the importance of diet, exercise, and its role in overall health and mortality. The patient's FH and SH was reviewed, including their home life, tobacco status, and  drug and alcohol status.  Follow-up in 1 year for physical exam or additional follow-up below.  Follow-up: No follow-ups on file. Or follow-up in 1 year if not noted.  Signed,  Maud Deed. Montarius Kitagawa, MD   Allergies as of 02/09/2018   No Known Allergies     Medication List        Accurate as of 02/09/18  1:58 PM. Always use your most recent med list.          acetaminophen 500 MG tablet Commonly known as:  TYLENOL Take 500 mg by mouth every 6 (six) hours as needed.   amitriptyline 25 MG tablet Commonly known as:  ELAVIL Take 1 tablet (25 mg total) by mouth at bedtime.   aspirin 81 MG tablet Take 81 mg by mouth daily.   escitalopram 10 MG tablet Commonly known as:  LEXAPRO Take 1 tablet (10 mg total) by mouth daily.   lisinopril 20 MG tablet Commonly known as:  PRINIVIL,ZESTRIL TAKE 1 TABLET(20 MG) BY MOUTH DAILY   meloxicam 15 MG tablet Commonly known as:  MOBIC TAKE 1 TABLET BY MOUTH EVERY MORNING WITH FOOD   mometasone 0.1 % ointment Commonly known as:  ELOCON Apply topically 2 (two) times daily.   multivitamin tablet Take 1 tablet by mouth daily.   multivitamin with minerals tablet Take 1 tablet by mouth daily.   rosuvastatin 40 MG tablet Commonly known as:  CRESTOR Take 1 tablet (40 mg total) by mouth daily.   vitamin C 1000 MG tablet Take 1,000 mg by mouth daily.

## 2018-02-09 ENCOUNTER — Ambulatory Visit (INDEPENDENT_AMBULATORY_CARE_PROVIDER_SITE_OTHER): Payer: BLUE CROSS/BLUE SHIELD | Admitting: Family Medicine

## 2018-02-09 ENCOUNTER — Encounter: Payer: Self-pay | Admitting: Family Medicine

## 2018-02-09 VITALS — BP 120/80 | HR 75 | Temp 97.7°F | Ht 64.5 in | Wt 286.5 lb

## 2018-02-09 DIAGNOSIS — Z0000 Encounter for general adult medical examination without abnormal findings: Secondary | ICD-10-CM

## 2018-02-09 DIAGNOSIS — Z23 Encounter for immunization: Secondary | ICD-10-CM | POA: Diagnosis not present

## 2018-02-09 MED ORDER — ROSUVASTATIN CALCIUM 40 MG PO TABS: 40.0000 mg | ORAL_TABLET | Freq: Every day | ORAL | 3 refills | Status: DC

## 2018-02-09 MED ORDER — MOMETASONE FUROATE 0.1 % EX OINT: TOPICAL_OINTMENT | Freq: Two times a day (BID) | CUTANEOUS | 1 refills | Status: DC

## 2018-03-07 ENCOUNTER — Other Ambulatory Visit: Payer: Self-pay | Admitting: Family Medicine

## 2018-03-07 NOTE — Telephone Encounter (Signed)
Last office visit 02/09/2018 for CPE. No future appointments scheduled.  Last refilled 01/05/2018 for #30 with 1 refill.  Ok to refill?

## 2018-03-30 ENCOUNTER — Other Ambulatory Visit: Payer: Self-pay | Admitting: Family Medicine

## 2018-04-08 ENCOUNTER — Other Ambulatory Visit: Payer: Self-pay | Admitting: Family Medicine

## 2018-06-07 ENCOUNTER — Other Ambulatory Visit: Payer: Self-pay | Admitting: *Deleted

## 2018-06-07 MED ORDER — ROSUVASTATIN CALCIUM 40 MG PO TABS: 40.0000 mg | ORAL_TABLET | Freq: Every day | ORAL | 2 refills | Status: DC

## 2018-07-21 ENCOUNTER — Other Ambulatory Visit: Payer: Self-pay | Admitting: Family Medicine

## 2018-07-21 NOTE — Telephone Encounter (Signed)
Last office visit 02/09/2018 for CPE.  Last refilled 12/30/2017 for #90 with 1 refill.  No future appointments.

## 2018-07-24 ENCOUNTER — Encounter: Payer: Self-pay | Admitting: Adult Health

## 2018-07-28 ENCOUNTER — Ambulatory Visit (INDEPENDENT_AMBULATORY_CARE_PROVIDER_SITE_OTHER): Admitting: Neurology

## 2018-07-28 ENCOUNTER — Encounter: Payer: Self-pay | Admitting: Neurology

## 2018-07-28 VITALS — BP 139/78 | HR 76 | Ht 65.0 in | Wt 301.0 lb

## 2018-07-28 DIAGNOSIS — G4710 Hypersomnia, unspecified: Secondary | ICD-10-CM | POA: Diagnosis not present

## 2018-07-28 DIAGNOSIS — M25471 Effusion, right ankle: Secondary | ICD-10-CM | POA: Diagnosis not present

## 2018-07-28 DIAGNOSIS — G4733 Obstructive sleep apnea (adult) (pediatric): Secondary | ICD-10-CM | POA: Diagnosis not present

## 2018-07-28 DIAGNOSIS — Z9989 Dependence on other enabling machines and devices: Secondary | ICD-10-CM

## 2018-07-28 DIAGNOSIS — R0682 Tachypnea, not elsewhere classified: Secondary | ICD-10-CM | POA: Diagnosis not present

## 2018-07-28 DIAGNOSIS — E669 Obesity, unspecified: Secondary | ICD-10-CM

## 2018-07-28 DIAGNOSIS — F17200 Nicotine dependence, unspecified, uncomplicated: Secondary | ICD-10-CM | POA: Insufficient documentation

## 2018-07-28 DIAGNOSIS — M25472 Effusion, left ankle: Secondary | ICD-10-CM

## 2018-07-28 MED ORDER — ESCITALOPRAM OXALATE 10 MG PO TABS: 20.0000 mg | ORAL_TABLET | Freq: Every day | ORAL | 1 refills | Status: DC
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
## 2018-07-28 NOTE — Patient Instructions (Signed)
Steps to Quit Smoking  Smoking tobacco can be bad for your health. It can also affect almost every organ in your body. Smoking puts you and people around you at risk for many serious long-lasting (chronic) diseases. Quitting smoking is hard, but it is one of the best things that you can do for your health. It is never too late to quit. What are the benefits of quitting smoking? When you quit smoking, you lower your risk for getting serious diseases and conditions. They can include:  Lung cancer or lung disease.  Heart disease.  Stroke.  Heart attack.  Not being able to have children (infertility).  Weak bones (osteoporosis) and broken bones (fractures). If you have coughing, wheezing, and shortness of breath, those symptoms may get better when you quit. You may also get sick less often. If you are pregnant, quitting smoking can help to lower your chances of having a baby of low birth weight. What can I do to help me quit smoking? Talk with your doctor about what can help you quit smoking. Some things you can do (strategies) include:  Quitting smoking totally, instead of slowly cutting back how much you smoke over a period of time.  Going to in-person counseling. You are more likely to quit if you go to many counseling sessions.  Using resources and support systems, such as: ? Online chats with a counselor. ? Phone quitlines. ? Printed self-help materials. ? Support groups or group counseling. ? Text messaging programs. ? Mobile phone apps or applications.  Taking medicines. Some of these medicines may have nicotine in them. If you are pregnant or breastfeeding, do not take any medicines to quit smoking unless your doctor says it is okay. Talk with your doctor about counseling or other things that can help you. Talk with your doctor about using more than one strategy at the same time, such as taking medicines while you are also going to in-person counseling. This can help make  quitting easier. What things can I do to make it easier to quit? Quitting smoking might feel very hard at first, but there is a lot that you can do to make it easier. Take these steps:  Talk to your family and friends. Ask them to support and encourage you.  Call phone quitlines, reach out to support groups, or work with a counselor.  Ask people who smoke to not smoke around you.  Avoid places that make you want (trigger) to smoke, such as: ? Bars. ? Parties. ? Smoke-break areas at work.  Spend time with people who do not smoke.  Lower the stress in your life. Stress can make you want to smoke. Try these things to help your stress: ? Getting regular exercise. ? Deep-breathing exercises. ? Yoga. ? Meditating. ? Doing a body scan. To do this, close your eyes, focus on one area of your body at a time from head to toe, and notice which parts of your body are tense. Try to relax the muscles in those areas.  Download or buy apps on your mobile phone or tablet that can help you stick to your quit plan. There are many free apps, such as QuitGuide from the CDC (Centers for Disease Control and Prevention). You can find more support from smokefree.gov and other websites. This information is not intended to replace advice given to you by your health care provider. Make sure you discuss any questions you have with your health care provider. Document Released: 03/07/2009 Document Revised: 01/07/2016   Document Reviewed: 09/25/2014 Elsevier Interactive Patient Education  2019 [employer]. Hypersomnia Hypersomnia is a condition in which a person feels very tired during the day even though he or she gets plenty of sleep at night. A person with this condition may take naps during the day and may find it very difficult to wake up from sleep. Hypersomnia may affect a person's ability to think, concentrate, drive, or remember things. What are the causes? The cause of this condition may not be known.  Possible causes include:  Certain medicines.  Sleep disorders, such as narcolepsy and sleep apnea.  Injury to the head, brain, or spinal cord.  Drug or alcohol use.  Gastroesophageal reflux disease (GERD).  Tumors.  Certain medical conditions, such as depression, diabetes, or an underactive thyroid gland (hypothyroidism). What are the signs or symptoms? The main symptoms of hypersomnia include:  Feeling very tired throughout the day, regardless of how much sleep you got the night before.  Having trouble waking up. Others may find it difficult to wake you up when you are sleeping.  Sleeping for longer and longer periods at a time.  Taking naps throughout the day. Other symptoms may include:  Feeling restless, anxious, or annoyed.  Lacking energy.  Having trouble with: ? Remembering. ? Speaking. ? Thinking.  Loss of appetite.  Seeing, hearing, tasting, smelling, or feeling things that are not real (hallucinations). How is this diagnosed? This condition may be diagnosed based on:  Your symptoms and medical history.  Your sleeping habits. Your health care provider may ask you to write down your sleeping habits in a daily sleep log, along with any symptoms you have.  A series of tests that are done while you sleep (sleep study or polysomnogram).  A test that measures how quickly you can fall asleep during the day (daytime nap study or multiple sleep latency test). How is this treated? Treatment can help you manage your condition. Treatment may include:  Following a regular sleep routine.  Lifestyle changes, such as changing your eating habits, getting regular exercise, and avoiding alcohol or caffeinated beverages.  Taking medicines to make you more alert (stimulants) during the day.  Treating any underlying medical causes of hypersomnia. Follow these instructions at home: Sleep routine   Schedule the same bedtime and wake-up time each day.  Practice a  relaxing bedtime routine. This may include reading, meditation, deep breathing, or taking a warm bath before going to sleep.  Get regular exercise each day. Avoid strenuous exercise in the evening hours.  Keep your sleep environment at a cooler temperature, darkened, and quiet.  Sleep with pillows and a mattress that are comfortable and supportive.  Schedule short 20-minute naps for when you feel sleepiest during the day.  Talk with your employer or teachers about your hypersomnia. If possible, adjust your schedule so that: ? You have a regular daytime work schedule. ? You can take a scheduled nap during the day. ? You do not have to work or be active at night.  Do not eat a heavy meal for a few hours before bedtime. Eat your meals at about the same times every day.  Avoid drinking alcohol or caffeinated beverages. Safety   Do not drive or use heavy machinery if you are sleepy. Ask your health care provider if it is safe for you to drive.  Wear a life jacket when swimming or spending time near water. General instructions  Take supplements and over-the-counter and prescription medicines only as told by  your health care provider.  Keep a sleep log that will help your doctor manage your condition. This may include information about: ? What time you go to bed each night. ? How often you wake up at night. ? How many hours you sleep at night. ? How often and for how long you nap during the day. ? Any observations from others, such as leg movements during sleep, sleep walking, or snoring.  Keep all follow-up visits as told by your health care provider. This is important. Contact a health care provider if:  You have new symptoms.  Your symptoms get worse. Get help right away if:  You have serious thoughts about hurting yourself or someone else. If you ever feel like you may hurt yourself or others, or have thoughts about taking your own life, get help right away. You can go to  your nearest emergency department or call:  Your local emergency services (911 in the [place]  A suicide crisis helpline, such as the National Suicide Prevention Lifeline at (479) 253-7176. This is open 24 hours a day. Summary  Hypersomnia refers to a condition in which you feel very tired during the day even though you get plenty of sleep at night.  A person with this condition may take naps during the day and may find it very difficult to wake up from sleep.  Hypersomnia may affect a person's ability to think, concentrate, drive, or remember things.  Treatment, such as following a regular sleep routine and making some lifestyle changes, can help you manage your condition. This information is not intended to replace advice given to you by your health care provider. Make sure you discuss any questions you have with your health care provider. Document Released: 05/01/2002 Document Revised: 05/13/2017 Document Reviewed: 05/13/2017 Elsevier Interactive Patient Education  2019 [employer].

## 2018-08-11 ENCOUNTER — Other Ambulatory Visit: Payer: Self-pay | Admitting: Neurology

## 2018-08-11 DIAGNOSIS — G4733 Obstructive sleep apnea (adult) (pediatric): Secondary | ICD-10-CM

## 2018-08-11 DIAGNOSIS — F17201 Nicotine dependence, unspecified, in remission: Secondary | ICD-10-CM

## 2018-08-11 DIAGNOSIS — Z9989 Dependence on other enabling machines and devices: Principal | ICD-10-CM

## 2018-08-11 DIAGNOSIS — G4710 Hypersomnia, unspecified: Secondary | ICD-10-CM

## 2018-08-11 DIAGNOSIS — F411 Generalized anxiety disorder: Secondary | ICD-10-CM

## 2018-08-11 LAB — NARCOLEPSY EVALUATION
DQA1*01:02: NEGATIVE
DQB1*06:02: NEGATIVE

## 2018-08-11 NOTE — Progress Notes (Unsigned)
I am dictating an overnight pulse oximetry result for the patient Kevin [name] date of birth 1960-12-04.  This overnight pulse oximetry was performed on 06 August 2018 and begun at 12:10 AM and ended at 8:23 AM the same day.   The patient slept for/a total recording time was 8hours.    a SpO2 over or at 90% was seen for 5 hours and 37 minutes.   There were 2 hours and 22 minutes of oxygen desaturation at or below 89% SPO2.   A desaturation -duration of 88% or less SPO2 was also reached for 1 hour and 60 minutes, therfore the patient would qualify for oxygen supplementation as in patient group 1.      Pulse rate varied between 54 and 102 bpm. Oxygen desaturation index was 9.75/h of recording.   Melvyn Novas, MD    The patient was tested on room air with a Respironics device 920 M plus.Marland Kitchen

## 2018-08-12 ENCOUNTER — Telehealth: Payer: Self-pay | Admitting: Neurology

## 2018-08-12 NOTE — Telephone Encounter (Signed)
Called the patient to review his ONO results. No answer. LVM instructing the patient to call back.

## 2018-08-12 NOTE — Telephone Encounter (Signed)
Pt returned call and I was able to advise about the ONO finding. Patient is established with [facility] with his CPAP and I will send the order to address hopefully adding the oxyen to the CPAP machine. Pt verbalized understanding.

## 2018-08-12 NOTE — Telephone Encounter (Signed)
-----   Message from Melvyn Novas, MD sent at 08/11/2018  5:01 PM EDT ----- Overnight pulse-oximetry was positive -A duration of 88% or less SPO2 was also reached for 1 hour and 60 minutes, therfore the patient would qualify for oxygen supplementation as in patient group 1.    Narcolepsy result negative. Hypersomnia is related to hypoxemia and/ or poorly controlled apnea.

## 2018-08-15 ENCOUNTER — Telehealth: Payer: Self-pay | Admitting: Neurology

## 2018-08-15 ENCOUNTER — Other Ambulatory Visit: Payer: Self-pay | Admitting: Neurology

## 2018-08-15 DIAGNOSIS — Z9989 Dependence on other enabling machines and devices: Principal | ICD-10-CM

## 2018-08-15 DIAGNOSIS — G4733 Obstructive sleep apnea (adult) (pediatric): Secondary | ICD-10-CM

## 2018-08-15 DIAGNOSIS — R0902 Hypoxemia: Secondary | ICD-10-CM

## 2018-08-15 NOTE — Telephone Encounter (Signed)
Oxygen at 2 liters to be bled into CPAP nocturnal use only( sleep time use only).   Based on Sleep Hypoxemia.   Cc Biagio Borg,  PCP Dr. Patsy Lager, MD

## 2018-09-16 ENCOUNTER — Encounter: Payer: Self-pay | Admitting: Family Medicine

## 2018-09-16 ENCOUNTER — Other Ambulatory Visit: Payer: Self-pay

## 2018-09-16 ENCOUNTER — Ambulatory Visit (INDEPENDENT_AMBULATORY_CARE_PROVIDER_SITE_OTHER): Admitting: Family Medicine

## 2018-09-16 ENCOUNTER — Ambulatory Visit: Payer: BLUE CROSS/BLUE SHIELD | Admitting: Family Medicine

## 2018-09-16 VITALS — BP 130/74 | HR 90 | Temp 97.8°F | Ht 64.5 in

## 2018-09-16 DIAGNOSIS — F17201 Nicotine dependence, unspecified, in remission: Secondary | ICD-10-CM

## 2018-09-16 DIAGNOSIS — R339 Retention of urine, unspecified: Secondary | ICD-10-CM

## 2018-09-16 DIAGNOSIS — R05 Cough: Secondary | ICD-10-CM

## 2018-09-16 DIAGNOSIS — R1909 Other intra-abdominal and pelvic swelling, mass and lump: Secondary | ICD-10-CM

## 2018-09-16 DIAGNOSIS — R059 Cough, unspecified: Secondary | ICD-10-CM

## 2018-09-16 DIAGNOSIS — Z6841 Body Mass Index (BMI) 40.0 and over, adult: Secondary | ICD-10-CM

## 2018-09-16 LAB — POC URINALSYSI DIPSTICK (AUTOMATED)
Bilirubin, UA: NEGATIVE
Glucose, UA: NEGATIVE
Ketones, UA: NEGATIVE
Leukocytes, UA: NEGATIVE
Nitrite, UA: NEGATIVE
Protein, UA: POSITIVE — AB
Spec Grav, UA: 1.02 (ref 1.010–1.025)
Urobilinogen, UA: 0.2 E.U./dL
pH, UA: 5.5 (ref 5.0–8.0)

## 2018-09-16 NOTE — Assessment & Plan Note (Signed)
Benign exam. Not consistent with [id number]. With other allergic symptoms, anticipate more allergic cough due to pollen. rec trial claritin or other OTC antihistamine. Update with effect or if progressing symptoms.  Need to consider ACEI induced cough if ongoing

## 2018-09-16 NOTE — Assessment & Plan Note (Signed)
Congratulated on smoking cessation.  

## 2018-09-16 NOTE — Progress Notes (Signed)
BP 130/74 (BP Location: Left Arm, Patient Position: Sitting, Cuff Size: Large)   Pulse 90   Temp 97.8 F (36.6 C) (Oral)   Ht 5' 4.5" (1.638 m)   SpO2 98%   BMI 50.87 kg/m    CC: urinary concerns, cough Subjective:    Patient ID: Kevin KannerNoeal G Soroka, male    DOB: 09/16/1960, 58 y.o.   MRN: [id number]  HPI: Kevin [name] is a 58 y.o. male presenting on 09/16/2018 for Urinary Problems (C/o slow urine stream and firmness in prostate.  Started about 1 mo ago. ) and Cough (dry cough x 2 weeks)   Last night wife Banker(RN) noticed swollen gland in L groin. Over the past 1-2 months he has noticed difficulty fully emptying with slowed stream. No dysuria, urgency, hematuria. No lower abd pain, flank pain, fevers/chills, nausea/vomiting. Nocturia x1 maybe. No known skin infections or bug bites recently. No urethral discharge, scrotal swelling, penile pain. No new sexual contacts.   Dry intermittent cough for 2 wks without fever, chills, dyspnea, ST, PNDrainage, significant head or chest congestion. No ear or tooth pain. No recent travel of known covid exposure. Increasing itchy watery eyes and sneezing. No significant allergic rhinitis history. No significant GERD sxs.   OSA on CPAP, uses 2L O2 at night.   Quit smoking 2 months ago cold [place]. Previously smoked 15 yrs (1/2 ppd).   Lab Results  Component Value Date   PSA 0.52 02/02/2018   PSA 0.76 02/01/2017   PSA 0.44 01/14/2015       Relevant past medical, surgical, family and social history reviewed and updated as indicated. Interim medical history since our last visit reviewed. Allergies and medications reviewed and updated. Outpatient Medications Prior to Visit  Medication Sig Dispense Refill  . acetaminophen (TYLENOL) 500 MG tablet Take 500 mg by mouth every 6 (six) hours as needed.    Marland Kitchen. amitriptyline (ELAVIL) 25 MG tablet TAKE 1 TABLET(25 MG) BY MOUTH AT BEDTIME 30 tablet 5  . Ascorbic Acid (VITAMIN C) 1000 MG tablet Take 1,000 mg  by mouth daily.    Marland Kitchen. aspirin 81 MG tablet Take 81 mg by mouth daily.      Marland Kitchen. escitalopram (LEXAPRO) 10 MG tablet Take 2 tablets (20 mg total) by mouth at bedtime. 180 tablet 1  . lisinopril (PRINIVIL,ZESTRIL) 20 MG tablet TAKE 1 TABLET(20 MG) BY MOUTH DAILY 90 tablet 1  . meloxicam (MOBIC) 15 MG tablet TAKE 1 TABLET BY MOUTH EVERY MORNING WITH FOOD 90 tablet 1  . mometasone (ELOCON) 0.1 % ointment Apply topically 2 (two) times daily. 45 g 1  . Multiple Vitamin (MULTIVITAMIN) tablet Take 1 tablet by mouth daily.    . Multiple Vitamins-Minerals (MULTIVITAMIN WITH MINERALS) tablet Take 1 tablet by mouth daily.    . rosuvastatin (CRESTOR) 40 MG tablet Take 1 tablet (40 mg total) by mouth daily. 90 tablet 2   No facility-administered medications prior to visit.      Per HPI unless specifically indicated in ROS section below Review of Systems Objective:    BP 130/74 (BP Location: Left Arm, Patient Position: Sitting, Cuff Size: Large)   Pulse 90   Temp 97.8 F (36.6 C) (Oral)   Ht 5' 4.5" (1.638 m)   SpO2 98%   BMI 50.87 kg/m   Wt Readings from Last 3 Encounters:  07/28/18 (!) 301 lb (136.5 kg)  02/09/18 286 lb 8 oz (130 kg)  01/05/18 285 lb 8 oz (129.5 kg)  Physical Exam Vitals signs and nursing note reviewed.  Constitutional:      General: He is not in acute distress.    Appearance: Normal appearance. He is well-developed.  HENT:     Head: Normocephalic and atraumatic.     Right Ear: Hearing, tympanic membrane, ear canal and external ear normal.     Left Ear: Hearing, tympanic membrane, ear canal and external ear normal.     Nose: Nose normal. No mucosal edema or rhinorrhea.     Right Sinus: No maxillary sinus tenderness or frontal sinus tenderness.     Left Sinus: No maxillary sinus tenderness or frontal sinus tenderness.     Mouth/Throat:     Mouth: Mucous membranes are moist.     Pharynx: Uvula midline. No oropharyngeal exudate or posterior oropharyngeal erythema.      Tonsils: No tonsillar abscesses.  Eyes:     General: No scleral icterus.    Extraocular Movements: Extraocular movements intact.     Conjunctiva/sclera: Conjunctivae normal.     Pupils: Pupils are equal, round, and reactive to light.  Neck:     Musculoskeletal: Normal range of motion and neck supple.  Cardiovascular:     Rate and Rhythm: Normal rate and regular rhythm.     Pulses: Normal pulses.     Heart sounds: Normal heart sounds. No murmur.  Pulmonary:     Effort: Pulmonary effort is normal. No respiratory distress.     Breath sounds: Normal breath sounds. No wheezing, rhonchi or rales.     Comments: Lungs clear without cough during office visit Abdominal:     General: Abdomen is flat. There is no distension.     Palpations: Abdomen is soft. There is no mass.     Tenderness: There is no abdominal tenderness. There is no right CVA tenderness, left CVA tenderness, guarding or rebound.     Hernia: No hernia is present. There is no hernia in the right inguinal area or left inguinal area.  Genitourinary:    Pubic Area: No rash.      Penis: Normal and uncircumcised.      Scrotum/Testes: Normal.        Right: Mass or tenderness not present.        Left: Mass or tenderness not present.     Prostate: Normal. Not enlarged (20gm), not tender and no nodules present.     Rectum: Normal. No mass, tenderness, anal fissure, external hemorrhoid or internal hemorrhoid. Normal anal tone.       Comments: Circumscribed nontender firm swelling near midline at inguinal region Lymphadenopathy:     Cervical: No cervical adenopathy.     Lower Body: No right inguinal adenopathy. No left inguinal adenopathy.  Skin:    General: Skin is warm and dry.     Findings: No rash.  Neurological:     Mental Status: He is alert.       Results for orders placed or performed in visit on 09/16/18  POCT Urinalysis Dipstick (Automated)  Result Value Ref Range   Color, UA yellow    Clarity, UA clear     Glucose, UA Negative Negative   Bilirubin, UA negative    Ketones, UA negative    Spec Grav, UA 1.020 1.010 - 1.025   Blood, UA 2+    pH, UA 5.5 5.0 - 8.0   Protein, UA Positive (A) Negative   Urobilinogen, UA 0.2 0.2 or 1.0 E.U./dL   Nitrite, UA negative    Leukocytes, UA  Negative Negative   Assessment & Plan:   Problem List Items Addressed This Visit    Tobacco abuse, in remission    Congratulated on smoking cessation.       Morbid obesity with BMI of 50.0-59.9, adult (HCC)   Incomplete bladder emptying    Newly noted over the last few months. UA with 2+ blood and protein, however micro reassuring. Will check UCx to r/o contributory UTI. Benign prostate exam today without significant enlargement noted.      Relevant Orders   POCT Urinalysis Dipstick (Automated) (Completed)   Urine Culture   Groin mass - Primary    Feels more like benign lipoma than lymphadenopathy or cyst. Discussed with patient. Will schedule soft tissue ultrasound to further evaluate - ok to postpone a few months with coronavirus pandemic concerns.       Cough    Benign exam. Not consistent with [id number]. With other allergic symptoms, anticipate more allergic cough due to pollen. rec trial claritin or other OTC antihistamine. Update with effect or if progressing symptoms.  Need to consider ACEI induced cough if ongoing           No orders of the defined types were placed in this encounter.  Orders Placed This Encounter  Procedures  . Urine Culture  . POCT Urinalysis Dipstick (Automated)    Follow up plan: No follow-ups on file.  Eustaquio Boyden, MD

## 2018-09-16 NOTE — Assessment & Plan Note (Signed)
Newly noted over the last few months. UA with 2+ blood and protein, however micro reassuring. Will check UCx to r/o contributory UTI. Benign prostate exam today without significant enlargement noted.

## 2018-09-16 NOTE — Assessment & Plan Note (Signed)
Feels more like benign lipoma than lymphadenopathy or cyst. Discussed with patient. Will schedule soft tissue ultrasound to further evaluate - ok to postpone a few months with coronavirus pandemic concerns.

## 2018-09-16 NOTE — Patient Instructions (Signed)
Urine overall ok today. We will send culture to verify.  For cough - sounds more allergic - try OTC claritin or allegra daily for 1-2 weeks and update [place] with effect. Let us know sooner if new symptoms develop. I think you have lipoma in groin - we will order ultrasound to be done in next 1-2 months.  Good to see you today let us know if any questions.

## 2018-09-17 LAB — URINE CULTURE
MICRO NUMBER:: 420178
Result:: NO GROWTH
SPECIMEN QUALITY:: ADEQUATE

## 2018-09-19 ENCOUNTER — Other Ambulatory Visit: Payer: Self-pay | Admitting: Family Medicine

## 2018-09-19 ENCOUNTER — Ambulatory Visit: Payer: BLUE CROSS/BLUE SHIELD | Admitting: Family Medicine

## 2018-09-20 ENCOUNTER — Telehealth: Payer: Self-pay | Admitting: Family Medicine

## 2018-09-20 NOTE — Telephone Encounter (Signed)
Patient returned call to get his lab results.

## 2018-09-20 NOTE — Telephone Encounter (Signed)
See result notes in labs, 09/16/18.

## 2018-10-14 ENCOUNTER — Ambulatory Visit (INDEPENDENT_AMBULATORY_CARE_PROVIDER_SITE_OTHER): Admitting: Family Medicine

## 2018-10-14 ENCOUNTER — Telehealth: Payer: Self-pay

## 2018-10-14 ENCOUNTER — Encounter: Payer: Self-pay | Admitting: Family Medicine

## 2018-10-14 ENCOUNTER — Other Ambulatory Visit

## 2018-10-14 VITALS — BP 143/80 | HR 76 | Temp 98.3°F | Ht 64.5 in | Wt 295.0 lb

## 2018-10-14 DIAGNOSIS — R339 Retention of urine, unspecified: Secondary | ICD-10-CM

## 2018-10-14 DIAGNOSIS — R319 Hematuria, unspecified: Secondary | ICD-10-CM

## 2018-10-14 LAB — POC URINALSYSI DIPSTICK (AUTOMATED)
Bilirubin, UA: NEGATIVE
Glucose, UA: NEGATIVE
Ketones, UA: NEGATIVE
Leukocytes, UA: NEGATIVE
Nitrite, UA: NEGATIVE
Protein, UA: POSITIVE — AB
Spec Grav, UA: 1.02 (ref 1.010–1.025)
Urobilinogen, UA: 0.2 E.U./dL
pH, UA: 6 (ref 5.0–8.0)

## 2018-10-14 MED ORDER — TAMSULOSIN HCL 0.4 MG PO CAPS: 0.4000 mg | ORAL_CAPSULE | Freq: Every day | ORAL | 1 refills | Status: DC

## 2018-10-14 NOTE — Assessment & Plan Note (Signed)
Ongoing trouble. He does drink significant caffeine. Advised back off caffeine and other bladder irritants.  DRE reassuring last month, however could have anterior BPH - will trial flomax 0.4mg  nightly.  UCx sent as well.  Pt agrees with plan.

## 2018-10-14 NOTE — Assessment & Plan Note (Signed)
Small amt (3-5 RBC/hpf). Has not had hematuria eval in the past - will refer to urology for further evaluation.

## 2018-10-14 NOTE — Telephone Encounter (Signed)
Pt was triaged earlier by R Laws LPN and pt has appt 10/14/18 at 2:30 with Dr Reece Agar with urine specimen prior to appt.

## 2018-10-14 NOTE — Progress Notes (Signed)
Virtual visit completed through Doxy.Me. Due to national recommendations of social distancing due to COVID-19, a virtual visit is felt to be most appropriate for this patient at this time.   Patient location: home Provider location: [place] at [facility], office If any vitals were documented, they were collected by patient at home unless specified below.    BP (!) 143/80 (BP Location: Left Arm, Patient Position: Sitting)   Pulse 76   Temp 98.3 F (36.8 C) (Oral)   Ht 5' 4.5" (1.638 m)   Wt 295 lb (133.8 kg)   BMI 49.85 kg/m    CC: urinary concerns Subjective:    Patient ID: Kevin [name], male    DOB: 01/30/1961, 58 y.o.   MRN: [id number]  HPI: Kevin [name] is a 58 y.o. male presenting on 10/14/2018 for Urinary Frequency (C/o urinary frequency, has occasional burning with urination and urinary urgency. Sxs started about 3 days ago. )   Ongoing trouble with slowed stream and incomplete emptying. Weakening of the stream noted as well. Mild sharp pain intermittently at penis with voiding. Seen 08/2018 with reassuring DRE. UCx at that time without infection.   No fevers/chills, hematuria, abd pain, nausea/vomiting, flank pain. No rectal pain.  No recent abx use.  Nocturia x1 maybe.   Staying hydrated.  Drinks a lot of coffee/caffeine.      Relevant past medical, surgical, family and social history reviewed and updated as indicated. Interim medical history since our last visit reviewed. Allergies and medications reviewed and updated. Outpatient Medications Prior to Visit  Medication Sig Dispense Refill  . acetaminophen (TYLENOL) 500 MG tablet Take 500 mg by mouth every 6 (six) hours as needed.    Marland Kitchen amitriptyline (ELAVIL) 25 MG tablet TAKE 1 TABLET(25 MG) BY MOUTH AT BEDTIME (Patient taking differently: As needed) 30 tablet 5  . Ascorbic Acid (VITAMIN C) 1000 MG tablet Take 1,000 mg by mouth daily.    Marland Kitchen aspirin 81 MG tablet Take 81 mg by mouth daily.      Marland Kitchen  escitalopram (LEXAPRO) 10 MG tablet Take 2 tablets (20 mg total) by mouth at bedtime. 180 tablet 1  . lisinopril (PRINIVIL,ZESTRIL) 20 MG tablet TAKE 1 TABLET(20 MG) BY MOUTH DAILY 90 tablet 1  . meloxicam (MOBIC) 15 MG tablet TAKE 1 TABLET BY MOUTH EVERY MORNING WITH FOOD 90 tablet 1  . mometasone (ELOCON) 0.1 % ointment Apply topically 2 (two) times daily. (Patient taking differently: Apply topically 2 (two) times daily. As needed) 45 g 1  . Multiple Vitamins-Minerals (MULTIVITAMIN WITH MINERALS) tablet Take 1 tablet by mouth daily.    . rosuvastatin (CRESTOR) 40 MG tablet Take 1 tablet (40 mg total) by mouth daily. 90 tablet 2  . Multiple Vitamin (MULTIVITAMIN) tablet Take 1 tablet by mouth daily.     No facility-administered medications prior to visit.      Per HPI unless specifically indicated in ROS section below Review of Systems Objective:    BP (!) 143/80 (BP Location: Left Arm, Patient Position: Sitting)   Pulse 76   Temp 98.3 F (36.8 C) (Oral)   Ht 5' 4.5" (1.638 m)   Wt 295 lb (133.8 kg)   BMI 49.85 kg/m   Wt Readings from Last 3 Encounters:  10/14/18 295 lb (133.8 kg)  07/28/18 (!) 301 lb (136.5 kg)  02/09/18 286 lb 8 oz (130 kg)     Physical exam: Gen: alert, NAD, not ill appearing Pulm: speaks in complete sentences without increased  work of breathing Psych: normal mood, normal thought content      Results for orders placed or performed in visit on 10/14/18  POCT Urinalysis Dipstick (Automated)  Result Value Ref Range   Color, UA yellow    Clarity, UA clear    Glucose, UA Negative Negative   Bilirubin, UA negative    Ketones, UA negative    Spec Grav, UA 1.020 1.010 - 1.025   Blood, UA 2+    pH, UA 6.0 5.0 - 8.0   Protein, UA Positive (A) Negative   Urobilinogen, UA 0.2 0.2 or 1.0 E.U./dL   Nitrite, UA negative    Leukocytes, UA Negative Negative   Assessment & Plan:   Problem List Items Addressed This Visit    Incomplete bladder emptying - Primary     Ongoing trouble. He does drink significant caffeine. Advised back off caffeine and other bladder irritants.  DRE reassuring last month, however could have anterior BPH - will trial flomax 0.4mg  nightly.  UCx sent as well.  Pt agrees with plan.       Relevant Orders   POCT Urinalysis Dipstick (Automated) (Completed)   Urine Culture   Hematuria    Small amt (3-5 RBC/hpf). Has not had hematuria eval in the past - will refer to urology for further evaluation.       Relevant Orders   Ambulatory referral to Urology       Meds ordered this encounter  Medications  . tamsulosin (FLOMAX) 0.4 MG CAPS capsule    Sig: Take 1 capsule (0.4 mg total) by mouth daily.    Dispense:  30 capsule    Refill:  1   Orders Placed This Encounter  Procedures  . Urine Culture  . Ambulatory referral to Urology    Referral Priority:   Routine    Referral Type:   Consultation    Referral Reason:   Specialty Services Required    Requested Specialty:   Urology    Number of Visits Requested:   1  . POCT Urinalysis Dipstick (Automated)    Follow up plan: No follow-ups on file.  Eustaquio BoydenJavier Joee Iovine, MD

## 2018-10-14 NOTE — Telephone Encounter (Signed)
Copied from CRM [id number]. Topic: Appointment Scheduling - Scheduling Inquiry for Clinic >> Oct 13, 2018  4:54 PM Dalphine Handing A wrote: Reason for CRM: Patient stated that he would like to schedule an appointment due to not being able to urinate and would like a callback.

## 2018-10-14 NOTE — Telephone Encounter (Signed)
Will see then. Thanks. 

## 2018-10-15 LAB — URINE CULTURE
MICRO NUMBER:: 500454
Result:: NO GROWTH
SPECIMEN QUALITY:: ADEQUATE

## 2018-10-19 ENCOUNTER — Other Ambulatory Visit: Payer: Self-pay | Admitting: Family Medicine

## 2018-10-21 ENCOUNTER — Other Ambulatory Visit: Payer: Self-pay | Admitting: Family Medicine

## 2018-10-21 NOTE — Telephone Encounter (Deleted)
Last office visit 10/14/2018 with Dr. Reece Agar for Urinary frequency.  Last refilled 07/21/2018 for #90 with

## 2018-10-29 ENCOUNTER — Encounter: Payer: Self-pay | Admitting: Adult Health

## 2018-10-31 ENCOUNTER — Telehealth: Payer: Self-pay | Admitting: *Deleted

## 2018-10-31 NOTE — Telephone Encounter (Signed)
Due to current COVID 19 pandemic, our office is severely reducing in office visits until further notice, in order to minimize the risk to our patients and healthcare providers.  Pt understands that although there may be some limitations with this type of visit, we will take all precautions to reduce any security or privacy concerns.  Pt understands that this will be treated like an in office visit and we will file with pt's insurance, and there may be a patient responsible charge related to this service. Wife consented to [email or web address] visit for pt. Email sent to [email or web address].

## 2018-11-02 ENCOUNTER — Ambulatory Visit (INDEPENDENT_AMBULATORY_CARE_PROVIDER_SITE_OTHER): Admitting: Adult Health

## 2018-11-02 ENCOUNTER — Other Ambulatory Visit: Payer: Self-pay

## 2018-11-02 ENCOUNTER — Encounter: Payer: Self-pay | Admitting: Adult Health

## 2018-11-02 DIAGNOSIS — G4733 Obstructive sleep apnea (adult) (pediatric): Secondary | ICD-10-CM | POA: Diagnosis not present

## 2018-11-02 DIAGNOSIS — Z9989 Dependence on other enabling machines and devices: Secondary | ICD-10-CM

## 2018-11-02 NOTE — Progress Notes (Signed)
PATIENT: Kevin [name] DOB: 07/08/1960  REASON FOR VISIT: follow up HISTORY FROM: patient  Virtual Visit via Video Note  I connected with Kevin [name] on 11/02/18 at  9:30 AM EDT by a video enabled telemedicine application located [facility] and verified that I am speaking with the correct person using two identifiers who was located at their own home.   I discussed the limitations of evaluation and management by telemedicine and the availability of in person appointments. The patient expressed understanding and agreed to proceed.   PATIENT: Kevin [name] DOB: 01/04/1961  REASON FOR VISIT: follow up HISTORY FROM: patient  HISTORY OF PRESENT ILLNESS: Today 11/02/18:  [name] is a 58 year old male with a history of obstructive sleep apnea on CPAP and hypoxemia.  His download indicates that he uses machine nightly for compliance of 100%.  He uses machine greater than 4 hours each night.  On average he uses his machine 8 hours and 41 minutes.  His residual AHI is 1.8 on 8-20 cm of water with EPR 3.  His leak in the 95th percentile is 47.5 L/min.  He does report that he feels his mask leaking at night.  He does change out his supplies regularly.  He joins me today for virtual visit.    REVIEW OF SYSTEMS: Out of a complete 14 system review of symptoms, the patient complains only of the following symptoms, and all other reviewed systems are negative.  See HPI  ALLERGIES: No Known Allergies  HOME MEDICATIONS: Outpatient Medications Prior to Visit  Medication Sig Dispense Refill  . acetaminophen (TYLENOL) 500 MG tablet Take 500 mg by mouth every 6 (six) hours as needed.    Marland Kitchen. amitriptyline (ELAVIL) 25 MG tablet TAKE 1 TABLET(25 MG) BY MOUTH AT BEDTIME (Patient taking differently: As needed) 30 tablet 5  . Ascorbic Acid (VITAMIN C) 1000 MG tablet Take 1,000 mg by mouth daily.    Marland Kitchen. aspirin 81 MG tablet Take 81 mg by mouth daily.       Marland Kitchen. escitalopram (LEXAPRO) 10 MG tablet Take 2 tablets (20 mg total) by mouth at bedtime. 180 tablet 1  . lisinopril (PRINIVIL,ZESTRIL) 20 MG tablet TAKE 1 TABLET(20 MG) BY MOUTH DAILY 90 tablet 1  . meloxicam (MOBIC) 15 MG tablet TAKE 1 TABLET BY MOUTH EVERY MORNING WITH FOOD 90 tablet 1  . mometasone (ELOCON) 0.1 % ointment Apply topically 2 (two) times daily. (Patient taking differently: Apply topically 2 (two) times daily. As needed) 45 g 1  . Multiple Vitamins-Minerals (MULTIVITAMIN WITH MINERALS) tablet Take 1 tablet by mouth daily.    . rosuvastatin (CRESTOR) 40 MG tablet Take 1 tablet (40 mg total) by mouth daily. 90 tablet 2  . tamsulosin (FLOMAX) 0.4 MG CAPS capsule Take 1 capsule (0.4 mg total) by mouth daily. 30 capsule 1   No facility-administered medications prior to visit.     PAST MEDICAL HISTORY: Past Medical History:  Diagnosis Date  . Hyperlipidemia   . Hypertension   . Obesity     PAST SURGICAL HISTORY: No past surgical history on file.  FAMILY HISTORY: Family History  Problem Relation Age of Onset  . Heart attack Mother   . Coronary artery disease Mother   . COPD Father   . Diabetes Father     SOCIAL HISTORY: Social History   Socioeconomic History  . Marital status: Married    Spouse name: [name]  . Number of children: Not on  file  . Years of education: Not on file  . Highest education level: Not on file  Occupational History  . Occupation: MILITARY    Employer: [employer]    Employer: [employer]    Employer: [employer]    Comment: (bought out [employer])  Social Needs  . Financial resource strain: Not on file  . Food insecurity:    Worry: Not on file    Inability: Not on file  . Transportation needs:    Medical: Not on file    Non-medical: Not on file  Tobacco Use  . Smoking status: Former Smoker    Packs/day: 1.00    Years: 15.00    Pack years: 15.00    Types: Cigarettes    Last attempt to quit: 06/27/2018    Years since quitting: 0.3  .  Smokeless tobacco: Never Used  Substance and Sexual Activity  . Alcohol use: No    Alcohol/week: 0.0 standard drinks  . Drug use: No  . Sexual activity: Not on file  Lifestyle  . Physical activity:    Days per week: Not on file    Minutes per session: Not on file  . Stress: Not on file  Relationships  . Social connections:    Talks on phone: Not on file    Gets together: Not on file    Attends religious service: Not on file    Active member of club or organization: Not on file    Attends meetings of clubs or organizations: Not on file    Relationship status: Not on file  . Intimate partner violence:    Fear of current or ex partner: Not on file    Emotionally abused: Not on file    Physically abused: Not on file    Forced sexual activity: Not on file  Other Topics Concern  . Not on file  Social History Narrative   [employer]-2009, [employer]   Caffeine 4-5 cups daily avg.   Married no kids.    [employer], now [employer]      PHYSICAL EXAM Generalized: Well developed, in no acute distress   Neurological examination  Mentation: Alert oriented to time, place, history taking. Follows all commands speech and language fluent Cranial nerve II-XII:Extraocular movements were full. Facial symmetry noted. uvula tongue midline. Head turning and shoulder shrug  were normal and symmetric. Motor: Good strength throughout subjectively per patient Sensory: Sensory testing is intact to soft touch on all 4 extremities subjectively per patient Coordination: Cerebellar testing reveals good finger-nose-finger  Reflexes: UTA  DIAGNOSTIC DATA (LABS, IMAGING, TESTING) - I reviewed patient records, labs, notes, testing and imaging myself where available.  Lab Results  Component Value Date   WBC 9.3 02/02/2018   HGB 14.3 02/02/2018   HCT 41.0 02/02/2018   MCV 90.0 02/02/2018   PLT 246.0 02/02/2018      Component Value Date/Time   NA 139 02/02/2018 1448   K 4.3 02/02/2018 1448   CL 106 02/02/2018  1448   CO2 26 02/02/2018 1448   GLUCOSE 72 02/02/2018 1448   BUN 19 02/02/2018 1448   CREATININE 0.99 02/02/2018 1448   CALCIUM 9.5 02/02/2018 1448   PROT 6.6 02/02/2018 1448   ALBUMIN 4.1 02/02/2018 1448   AST 19 02/02/2018 1448   ALT 34 02/02/2018 1448   ALKPHOS 59 02/02/2018 1448   BILITOT 0.4 02/02/2018 1448   GFRNONAA 110.09 12/31/2008 0936   Lab Results  Component Value Date   CHOL 137 02/02/2018  HDL 23.80 (L) 02/02/2018   LDLCALC 92 01/14/2015   LDLDIRECT 85.0 02/02/2018   TRIG 351.0 (H) 02/02/2018   CHOLHDL 6 02/02/2018      ASSESSMENT AND PLAN 58 y.o. year old male  has a past medical history of Hyperlipidemia, Hypertension, and Obesity. here with:  1.  Obstructive sleep apnea on CPAP 2.  Hypoxemia  The patient will continue on CPAP therapy.  He is encouraged to continue using his CPAP nightly and greater than 4 hours each night.  His download shows excellent compliance and good treatment of his apnea.  He will continue use of supplemental oxygen at bedtime.  I have advised that if his symptoms worsen or he develops new symptoms he should let us know.  He will follow-up in 1 year or sooner if needed.   I spent 15 minutes with the patient this time was spent reviewing CPAP download   Ward Givens, MSN, NP-C 11/02/2018, 9:35 AM [facility] [address], [place], [place] [address] [phone]

## 2018-11-16 ENCOUNTER — Other Ambulatory Visit: Payer: Self-pay | Admitting: Family Medicine

## 2018-11-23 ENCOUNTER — Encounter: Payer: Self-pay | Admitting: Urology

## 2018-11-23 ENCOUNTER — Other Ambulatory Visit: Payer: Self-pay

## 2018-11-23 ENCOUNTER — Ambulatory Visit (INDEPENDENT_AMBULATORY_CARE_PROVIDER_SITE_OTHER): Admitting: Urology

## 2018-11-23 VITALS — BP 180/97 | HR 101 | Ht 64.0 in | Wt 295.0 lb

## 2018-11-23 DIAGNOSIS — R3916 Straining to void: Secondary | ICD-10-CM

## 2018-11-23 DIAGNOSIS — R3121 Asymptomatic microscopic hematuria: Secondary | ICD-10-CM | POA: Diagnosis not present

## 2018-11-23 DIAGNOSIS — N401 Enlarged prostate with lower urinary tract symptoms: Secondary | ICD-10-CM

## 2018-11-23 LAB — MICROSCOPIC EXAMINATION: Bacteria, UA: NONE SEEN

## 2018-11-23 LAB — URINALYSIS, COMPLETE
Bilirubin, UA: NEGATIVE
Glucose, UA: NEGATIVE
Ketones, UA: NEGATIVE
Leukocytes,UA: NEGATIVE
Nitrite, UA: NEGATIVE
Specific Gravity, UA: 1.015 (ref 1.005–1.030)
Urobilinogen, Ur: 0.2 mg/dL (ref 0.2–1.0)
pH, UA: 5 (ref 5.0–7.5)

## 2018-11-23 MED ORDER — TAMSULOSIN HCL 0.4 MG PO CAPS: 0.4000 mg | ORAL_CAPSULE | Freq: Every day | ORAL | 3 refills | Status: DC

## 2018-11-23 NOTE — Progress Notes (Signed)
   11/23/2018 3:27 PM   Dangelo Dewitt Rota [birth year] [id number]  Referring provider: Owens Loffler, MD [place],  [place] [address]  CC: Microscopic hematuria  HPI: I saw Mr. Leinweber in urology clinic in consultation for asymptomatic microscopic hematuria from Dr. Lorelei Pont.  He is a 58 year old male with past medical history notable for obesity, hypertension, and sleep apnea on CPAP who presents with reported asymptomatic microscopic hematuria.  On chart review, he has 2 urinalyses in the last year with "2+ RBC" on microscopic.  Urinalysis today confirms microscopic hematuria with 3-10 RBCs per high-power field.  He denies any history of gross hematuria.  He has some mild urinary symptoms of weak stream and difficulty initiating stream that have been significantly improved with Flomax.  He denies any prior episodes of urinary retention.  He denies any dysuria, history of UTIs, urgency, or frequency.  He denies any incontinence.  He denies any family history of prostate cancer.  He is a former smoker with a 7-pack-year history, and quit 4 months ago.  He previously worked in [employer], and denies any other carcinogenic exposures.  There are no aggravating or alleviating factors.  Severity is mild.  PSA has been less than 1 and stable.   PMH: Past Medical History:  Diagnosis Date  . Hyperlipidemia   . Hypertension   . Obesity     Surgical History: Bilateral knee arthroscopy No prior abdominal surgeries  Allergies: No Known Allergies  Family History: Family History  Problem Relation Age of Onset  . Heart attack Mother   . Coronary artery disease Mother   . COPD Father   . Diabetes Father     Social History:  reports that he quit smoking about 4 months ago. His smoking use included cigarettes. He has a 15.00 pack-year smoking history. He has never used smokeless tobacco. He reports that he does not drink alcohol or use drugs.  ROS: Please see flowsheet from  today's date for complete review of systems.  Physical Exam: BP (!) 180/97 (BP Location: Left Arm, Patient Position: Sitting)   Pulse (!) 101   Ht 5\' 4"  (1.626 m)   Wt 295 lb (133.8 kg)   BMI 50.64 kg/m    Constitutional:  Alert and oriented, No acute distress. Cardiovascular: No clubbing, cyanosis, or edema. Respiratory: Normal respiratory effort, no increased work of breathing. GI: Abdomen is soft, nontender, nondistended, no abdominal masses Lymph: No cervical or inguinal lymphadenopathy. Skin: No rashes, bruises or suspicious lesions. Neurologic: Grossly intact, no focal deficits, moving all 4 extremities. Psychiatric: Normal mood and affect.  Laboratory Data: Urinalysis today 0 WBCs, 3-10 RBCs, no bacteria  Pertinent Imaging: None to review  Assessment & Plan:   In summary, the patient is a 58 year old male with asymptomatic microscopic hematuria and mild BPH symptoms well controlled on Flomax.  We reviewed the new AUA guidelines for microhematuria evaluation, and he falls into the intermediate risk category that warrants further work-up with cystoscopy and renal ultrasound.  We discussed possible causes of hematuria including BPH, urolithiasis, malignancy, medical renal disease, and idiopathic.  Renal ultrasound RTC for cystoscopy and review ultrasound results  Billey Co, MD  [place] [address], [place], [place] [address] [phone]

## 2018-11-23 NOTE — Patient Instructions (Signed)
Cystoscopy Cystoscopy is a procedure that is used to help diagnose and sometimes treat conditions that affect the lower urinary tract. The lower urinary tract includes the bladder and the urethra. The urethra is the tube that drains urine from the bladder. Cystoscopy is done using a thin, tube-shaped instrument with a light and camera at the end (cystoscope). The cystoscope may be hard or flexible, depending on the goal of the procedure. The cystoscope is inserted through the urethra, into the bladder. Cystoscopy may be recommended if you have:  Urinary tract infections that keep coming back.  Blood in the urine (hematuria).  An inability to control when you urinate (urinary incontinence) or an overactive bladder.  Unusual cells found in a urine sample.  A blockage in the urethra, such as a urinary stone.  Painful urination.  An abnormality in the bladder found during an intravenous pyelogram (IVP) or CT scan. Cystoscopy may also be done to remove a sample of tissue to be examined under a microscope (biopsy). Tell a health care provider about:  Any allergies you have.  All medicines you are taking, including vitamins, herbs, eye drops, creams, and over-the-counter medicines.  Any problems you or family members have had with anesthetic medicines.  Any blood disorders you have.  Any surgeries you have had.  Any medical conditions you have.  Whether you are pregnant or may be pregnant. What are the risks? Generally, this is a safe procedure. However, problems may occur, including:  Infection.  Bleeding.  Allergic reactions to medicines.  Damage to other structures or organs. What happens before the procedure?  Ask your health care provider about: ? Changing or stopping your regular medicines. This is especially important if you are taking diabetes medicines or blood thinners. ? Taking medicines such as aspirin and ibuprofen. These medicines can thin your blood. Do not take  these medicines unless your health care provider tells you to take them. ? Taking over-the-counter medicines, vitamins, herbs, and supplements.  Follow instructions from your health care provider about eating or drinking restrictions.  Ask your health care provider what steps will be taken to help prevent infection. These may include: ? Washing skin with a germ-killing soap. ? Taking antibiotic medicine.  You may have an exam or testing, such as: ? X-rays of the bladder, urethra, or kidneys. ? Urine tests to check for signs of infection.  Plan to have someone take you home from the hospital or clinic. What happens during the procedure?   You will be given one or more of the following: ? A medicine to help you relax (sedative). ? A medicine to numb the area (local anesthetic).  The area around the opening of your urethra will be cleaned.  The cystoscope will be passed through your urethra into your bladder.  Germ-free (sterile) fluid will flow through the cystoscope to fill your bladder. The fluid will stretch your bladder so that your health care provider can clearly examine your bladder walls.  Your doctor will look at the urethra and bladder. Your doctor may take a biopsy or remove stones.  The cystoscope will be removed, and your bladder will be emptied. The procedure may vary among health care providers and hospitals. What can I expect after the procedure? After the procedure, it is common to have:  Some soreness or pain in your abdomen and urethra.  Urinary symptoms. These include: ? Mild pain or burning when you urinate. Pain should stop within a few minutes after you urinate. This   may last for up to 1 week. ? A small amount of blood in your urine for several days. ? Feeling like you need to urinate but producing only a small amount of urine. Follow these instructions at home: Medicines  Take over-the-counter and prescription medicines only as told by your health care  provider.  If you were prescribed an antibiotic medicine, take it as told by your health care provider. Do not stop taking the antibiotic even if you start to feel better. General instructions  Return to your normal activities as told by your health care provider. Ask your health care provider what activities are safe for you.  Do not drive for 24 hours if you were given a sedative during your procedure.  Watch for any blood in your urine. If the amount of blood in your urine increases, call your health care provider.  Follow instructions from your health care provider about eating or drinking restrictions.  If a tissue sample was removed for testing (biopsy) during your procedure, it is up to you to get your test results. Ask your health care provider, or the department that is doing the test, when your results will be ready.  Drink enough fluid to keep your urine pale yellow.  Keep all follow-up visits as told by your health care provider. This is important. Contact a health care provider if you:  Have pain that gets worse or does not get better with medicine, especially pain when you urinate.  Have trouble urinating.  Have more blood in your urine. Get help right away if you:  Have blood clots in your urine.  Have abdominal pain.  Have a fever or chills.  Are unable to urinate. Summary  Cystoscopy is a procedure that is used to help diagnose and sometimes treat conditions that affect the lower urinary tract.  Cystoscopy is done using a thin, tube-shaped instrument with a light and camera at the end.  After the procedure, it is common to have some soreness or pain in your abdomen and urethra.  Watch for any blood in your urine. If the amount of blood in your urine increases, call your health care provider.  If you were prescribed an antibiotic medicine, take it as told by your health care provider. Do not stop taking the antibiotic even if you start to feel better. This  information is not intended to replace advice given to you by your health care provider. Make sure you discuss any questions you have with your health care provider. Document Released: 05/08/2000 Document Revised: 05/03/2018 Document Reviewed: 05/03/2018 Elsevier Patient Education  2020 Elsevier Inc.  

## 2018-12-19 ENCOUNTER — Other Ambulatory Visit: Payer: Self-pay | Admitting: Family Medicine

## 2018-12-22 ENCOUNTER — Other Ambulatory Visit: Payer: Self-pay | Admitting: Neurology

## 2019-01-02 ENCOUNTER — Other Ambulatory Visit: Payer: Self-pay

## 2019-01-02 ENCOUNTER — Encounter (INDEPENDENT_AMBULATORY_CARE_PROVIDER_SITE_OTHER): Payer: Self-pay

## 2019-01-02 ENCOUNTER — Ambulatory Visit
Admission: RE | Admit: 2019-01-02 | Discharge: 2019-01-02 | Disposition: A | Discharge status: Home / Self Care | Source: Ambulatory Visit | Attending: Urology | Admitting: Urology

## 2019-01-02 DIAGNOSIS — R3916 Straining to void: Secondary | ICD-10-CM | POA: Insufficient documentation

## 2019-01-02 DIAGNOSIS — N401 Enlarged prostate with lower urinary tract symptoms: Secondary | ICD-10-CM | POA: Insufficient documentation

## 2019-01-04 ENCOUNTER — Encounter: Payer: Self-pay | Admitting: Urology

## 2019-01-04 ENCOUNTER — Ambulatory Visit (INDEPENDENT_AMBULATORY_CARE_PROVIDER_SITE_OTHER): Admitting: Urology

## 2019-01-04 ENCOUNTER — Other Ambulatory Visit: Payer: Self-pay

## 2019-01-04 VITALS — BP 158/84 | HR 116 | Ht 68.0 in | Wt 295.0 lb

## 2019-01-04 DIAGNOSIS — R3121 Asymptomatic microscopic hematuria: Secondary | ICD-10-CM

## 2019-01-04 NOTE — Progress Notes (Signed)
Cystoscopy Procedure Note:  Indication: Microscopic hematuria, intermediate risk  After informed consent and discussion of the procedure and its risks, Kevin [name] was positioned and prepped in the standard fashion. Cystoscopy was performed with a flexible cystoscope. The urethra, bladder neck and entire bladder was visualized in a standard fashion. The prostate was moderate in size. The ureteral orifices were visualized in their normal location and orientation. Normal bladder mucosa throughout.  Imaging: Benign renal ultrasound, no hydro or solid masses  Findings: Normal cystoscopy  Assessment and Plan: RTC PRN  Nickolas Madrid, MD 01/04/2019

## 2019-01-05 LAB — URINALYSIS, COMPLETE
Bilirubin, UA: NEGATIVE
Glucose, UA: NEGATIVE
Ketones, UA: NEGATIVE
Leukocytes,UA: NEGATIVE
Nitrite, UA: NEGATIVE
Specific Gravity, UA: 1.025 (ref 1.005–1.030)
Urobilinogen, Ur: 0.2 mg/dL (ref 0.2–1.0)
pH, UA: 5.5 (ref 5.0–7.5)

## 2019-01-05 LAB — MICROSCOPIC EXAMINATION
Bacteria, UA: NONE SEEN
Epithelial Cells (non renal): NONE SEEN /hpf (ref 0–10)

## 2019-01-22 ENCOUNTER — Telehealth: Payer: Self-pay | Admitting: Family Medicine

## 2019-01-23 NOTE — Telephone Encounter (Signed)
Please schedule CPE with fasting labs for Dr. Lorelei Pont sometime after Sept 18,2020.

## 2019-01-27 NOTE — Telephone Encounter (Signed)
Labs 10/16 cpx 10/21 Pt aware

## 2019-02-07 ENCOUNTER — Other Ambulatory Visit: Payer: Self-pay | Admitting: *Deleted

## 2019-02-07 MED ORDER — LISINOPRIL 20 MG PO TABS: 20.0000 mg | ORAL_TABLET | Freq: Every day | ORAL | 0 refills | Status: DC

## 2019-02-07 NOTE — Telephone Encounter (Signed)
Last office visit 10/14/2018 with Dr. Darnell Level for Urinary frequency.  Last refilled 07/21/2018 for #90 with 1 refill.  CPE scheduled for 03/15/2019.

## 2019-02-08 MED ORDER — MELOXICAM 15 MG PO TABS: ORAL_TABLET | ORAL | 0 refills | Status: DC

## 2019-02-16 ENCOUNTER — Other Ambulatory Visit: Payer: Self-pay | Admitting: Family Medicine

## 2019-03-07 ENCOUNTER — Other Ambulatory Visit: Payer: Self-pay | Admitting: Family Medicine

## 2019-03-07 DIAGNOSIS — Z0000 Encounter for general adult medical examination without abnormal findings: Secondary | ICD-10-CM

## 2019-03-10 ENCOUNTER — Other Ambulatory Visit (INDEPENDENT_AMBULATORY_CARE_PROVIDER_SITE_OTHER)

## 2019-03-10 ENCOUNTER — Other Ambulatory Visit: Payer: Self-pay

## 2019-03-10 DIAGNOSIS — Z0000 Encounter for general adult medical examination without abnormal findings: Secondary | ICD-10-CM

## 2019-03-10 LAB — BASIC METABOLIC PANEL
BUN: 22 mg/dL (ref 6–23)
CO2: 24 mEq/L (ref 19–32)
Calcium: 9.5 mg/dL (ref 8.4–10.5)
Chloride: 105 mEq/L (ref 96–112)
Creatinine, Ser: 1.06 mg/dL (ref 0.40–1.50)
GFR: 71.94 mL/min (ref >60.00)
Glucose, Bld: 166 mg/dL — ABNORMAL HIGH (ref 70–99)
Potassium: 4 mEq/L (ref 3.5–5.1)
Sodium: 138 mEq/L (ref 135–145)

## 2019-03-10 LAB — HEPATIC FUNCTION PANEL
ALT: 65 U/L — ABNORMAL HIGH (ref 0–53)
AST: 38 U/L — ABNORMAL HIGH (ref 0–37)
Albumin: 4.1 g/dL (ref 3.5–5.2)
Alkaline Phosphatase: 64 U/L (ref 39–117)
Bilirubin, Direct: 0.1 mg/dL (ref 0.0–0.3)
Total Bilirubin: 0.4 mg/dL (ref 0.2–1.2)
Total Protein: 6.9 g/dL (ref 6.0–8.3)

## 2019-03-10 LAB — CBC WITH DIFFERENTIAL/PLATELET
Basophils Absolute: 0.1 10*3/uL (ref 0.0–0.1)
Basophils Relative: 0.9 % (ref 0.0–3.0)
Eosinophils Absolute: 0.3 10*3/uL (ref 0.0–0.7)
Eosinophils Relative: 4 % (ref 0.0–5.0)
HCT: 42.7 % (ref 39.0–52.0)
Hemoglobin: 14.6 g/dL (ref 13.0–17.0)
Lymphocytes Relative: 34.8 % (ref 12.0–46.0)
Lymphs Abs: 2.3 10*3/uL (ref 0.7–4.0)
MCHC: 34.2 g/dL (ref 30.0–36.0)
MCV: 88.8 fl (ref 78.0–100.0)
Monocytes Absolute: 0.6 10*3/uL (ref 0.1–1.0)
Monocytes Relative: 9.4 % (ref 3.0–12.0)
Neutro Abs: 3.3 10*3/uL (ref 1.4–7.7)
Neutrophils Relative %: 50.9 % (ref 43.0–77.0)
Platelets: 236 10*3/uL (ref 150.0–400.0)
RBC: 4.81 Mil/uL (ref 4.22–5.81)
RDW: 13.8 % (ref 11.5–15.5)
WBC: 6.5 10*3/uL (ref 4.0–10.5)

## 2019-03-10 LAB — HEMOGLOBIN A1C: Hgb A1c MFr Bld: 7.6 % — ABNORMAL HIGH (ref 4.6–6.5)

## 2019-03-10 LAB — LIPID PANEL
Cholesterol: 100 mg/dL (ref 0–200)
HDL: 28.5 mg/dL — ABNORMAL LOW (ref >39.00)
NonHDL: 71.85
Total CHOL/HDL Ratio: 4
Triglycerides: 218 mg/dL — ABNORMAL HIGH (ref 0.0–149.0)
VLDL: 43.6 mg/dL — ABNORMAL HIGH (ref 0.0–40.0)

## 2019-03-10 LAB — LDL CHOLESTEROL, DIRECT: Direct LDL: 55 mg/dL

## 2019-03-13 LAB — PSA, TOTAL WITH REFLEX TO PSA, FREE: PSA, Total: 0.6 ng/mL (ref <=4.0)

## 2019-03-14 NOTE — Progress Notes (Signed)
Alvon Nygaard T. Cortny Bambach, MD Primary Care and Sports Medicine [facility] [address] [place], [id number] Phone: [phone]  FAX: [phone]  Kevin [name] Goynes - 58 y.o. male  MRN [id number]  Date of Birth: 09/30/1960  Visit Date: 03/15/2019  PCP: Hannah Beatopland, Chaley Castellanos, MD  Referred by: Hannah Beatopland, Kenrick Pore, MD  Chief Complaint  Patient presents with  . Annual Exam   Patient Care Team: Hannah Beatopland, Araiyah Cumpton, MD as PCP - General Subjective:   Kevin [name] Pile is a 58 y.o. pleasant patient who presents with the following:  Preventative Health Maintenance Visit:  Health Maintenance Summary Reviewed and updated, unless pt declines services.  Tobacco History Reviewed.  Stopped.  Alcohol: No concerns, no excessive use Exercise Habits: Some activity, rec at least 30 mins 5 times a week STD concerns: no risk or activity to increase risk Drug Use: None Encouraged self-testicular check  Taking some time away from work. Socially distancing  Stomach will feel nauseated some times. Rinks a lot of coffee. and the first week in march no cigaretes.  Rd referral   New onset DM: He has new onset diabetes, and he does have a BMI of 50.  He was formally quite active and in the [employer] for a long time.  He has no symptoms at all right now.  He does have an elevated hemoglobin A1c.  Lab Results  Component Value Date   HGBA1C 7.6 (H) 03/10/2019     Immunization History  Administered Date(s) Administered  . Influenza,inj,Quad PF,6+ Mos 01/16/2015, 02/01/2017, 02/09/2018, 03/15/2019  . Pneumococcal Polysaccharide-23 03/15/2019  . Td 10/23/2005  . Tdap 02/01/2017   .  Health Maintenance  Topic Date Due  . HIV Screening  11/29/1976  . COLONOSCOPY  12/23/2023  . TETANUS/TDAP  02/02/2027  . INFLUENZA VACCINE  Completed  . Hepatitis C Screening  Completed   Immunization History  Administered Date(s) Administered  . Influenza,inj,Quad PF,6+ Mos  01/16/2015, 02/01/2017, 02/09/2018, 03/15/2019  . Pneumococcal Polysaccharide-23 03/15/2019  . Td 10/23/2005  . Tdap 02/01/2017   Patient Active Problem List   Diagnosis Date Noted  . Hematuria 10/14/2018  . Groin mass 09/16/2018  . 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 Tobacco dependence 07/28/2018  . OSA on CPAP 04/27/2016  . Generalized anxiety disorder 10/01/2014  . 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 Morbid obesity with BMI of 50.0-59.9, adult (HCC) 08/31/2013  . SLEEP APNEA 06/16/2010  . HYPERCHOLESTEROLEMIA 12/31/2008  . 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 HYPERTENSION 12/31/2008   Past Medical History:  Diagnosis Date  . Hyperlipidemia   . Hypertension   . Obesity    History reviewed. No pertinent surgical history. Social History   Socioeconomic History  . Marital status: Married    Spouse name: [name]  . Number of children: Not on file  . Years of education: Not on file  . Highest education level: Not on file  Occupational History  . Occupation: MILITARY    Employer: [employer]    Employer: [employer]    Employer: [employer]    Comment: (bought out [employer])  Social Needs  . Financial resource strain: Not on file  . Food insecurity    Worry: Not on file    Inability: Not on file  . Transportation needs    Medical: Not on file    Non-medical: Not on file  Tobacco Use  . Smoking status: Former Smoker    Packs/day: 1.00    Years: 15.00  Pack years: 15.00    Types: Cigarettes    Quit date: 06/27/2018    Years since quitting: 0.7  . Smokeless tobacco: Never Used  Substance and Sexual Activity  . Alcohol use: No    Alcohol/week: 0.0 standard drinks  . Drug use: No  . Sexual activity: Not on file  Lifestyle  . Physical activity    Days per week: Not on file    Minutes per session: Not on file  . Stress: Not on file  Relationships  . Social Musician on phone: Not on file    Gets together: Not on file    Attends religious  service: Not on file    Active member of club or organization: Not on file    Attends meetings of clubs or organizations: Not on file    Relationship status: Not on file  . Intimate partner violence    Fear of current or ex partner: Not on file    Emotionally abused: Not on file    Physically abused: Not on file    Forced sexual activity: Not on file  Other Topics Concern  . Not on file  Social History Narrative   Former [employer]-2009, E7   Caffeine 4-5 cups daily avg.   Married no kids.    [employer], now [facility]   Family History  Problem Relation Age of Onset  . Heart attack Mother   . Coronary artery disease Mother   . COPD Father   . Diabetes Father    No Known Allergies  Medication list has been reviewed and updated.   General: Denies fever, chills, sweats. No significant weight loss. Eyes: Denies blurring,significant itching ENT: Denies earache, sore throat, and hoarseness. Cardiovascular: Denies chest pains, palpitations, dyspnea on exertion Respiratory: Denies cough, dyspnea at rest,wheeezing Breast: no concerns about lumps GI: Denies nausea, vomiting, diarrhea, constipation, change in bowel habits, abdominal pain, melena, hematochezia GU: Denies penile discharge, ED, urinary flow / outflow problems. No STD concerns. Musculoskeletal: Denies back pain, joint pain Derm: Denies rash, itching Neuro: Denies  paresthesias, frequent falls, frequent headaches Psych: Denies depression, anxiety Endocrine: Denies cold intolerance, heat intolerance, polydipsia Heme: Denies enlarged lymph nodes Allergy: No hayfever  Objective:   BP 130/70   Pulse 82   Temp 98.9 F (37.2 C) (Temporal)   Ht 5' 5.25" (1.657 m)   Wt (!) 305 lb 8 oz (138.6 kg)   SpO2 95%   BMI 50.45 kg/m  Ideal Body Weight: Weight in (lb) to have BMI = 25: 151.1  Ideal Body Weight: Weight in (lb) to have BMI = 25: 151.1 No exam data present Depression screen [facility] 2/9 03/15/2019 02/09/2018 02/01/2017  Decreased  Interest 0 0 0  Down, Depressed, Hopeless 0 0 0  PHQ - 2 Score 0 0 0     GEN: well developed, well nourished, no acute distress Eyes: conjunctiva and lids normal, PERRLA, EOMI ENT: TM clear, nares clear, oral exam WNL Neck: supple, no lymphadenopathy, no thyromegaly, no JVD Pulm: clear to auscultation and percussion, respiratory effort normal CV: regular rate and rhythm, S1-S2, no murmur, rub or gallop, no bruits, peripheral pulses normal and symmetric, no cyanosis, clubbing, edema or varicosities GI: soft, non-tender; no hepatosplenomegaly, masses; active bowel sounds all quadrants GU: no hernia, testicular mass, penile discharge Lymph: no cervical, axillary or inguinal adenopathy MSK: gait normal, muscle tone and strength WNL, no joint swelling, effusions, discoloration, crepitus  SKIN: clear, good turgor, color WNL, no rashes,  lesions, or ulcerations Neuro: normal mental status, normal strength, sensation, and motion Psych: alert; oriented to person, place and time, normally interactive and not anxious or depressed in appearance.  All labs reviewed with patient. Results for orders placed or performed in visit on 03/10/19  Basic metabolic panel  Result Value Ref Range   Sodium 138 135 - 145 mEq/L   Potassium 4.0 3.5 - 5.1 mEq/L   Chloride 105 96 - 112 mEq/L   CO2 24 19 - 32 mEq/L   Glucose, Bld 166 (H) 70 - 99 mg/dL   BUN 22 6 - 23 mg/dL   Creatinine, Ser 4.28 0.40 - 1.50 mg/dL   Calcium 9.5 8.4 - 76.8 mg/dL   GFR 11.57 >26.20 mL/min  Hepatic function panel  Result Value Ref Range   Total Bilirubin 0.4 0.2 - 1.2 mg/dL   Bilirubin, Direct 0.1 0.0 - 0.3 mg/dL   Alkaline Phosphatase 64 39 - 117 U/L   AST 38 (H) 0 - 37 U/L   ALT 65 (H) 0 - 53 U/L   Total Protein 6.9 6.0 - 8.3 [name]/dL   Albumin 4.1 3.5 - 5.2 [name]/dL  PSA, Total with Reflex to PSA, Free  Result Value Ref Range   PSA, Total 0.6 < OR = 4.0 ng/mL  Hemoglobin A1c  Result Value Ref Range   Hgb A1c MFr Bld 7.6 (H) 4.6  - 6.5 %  CBC with Differential/Platelet  Result Value Ref Range   WBC 6.5 4.0 - 10.5 K/uL   RBC 4.81 4.22 - 5.81 Mil/uL   Hemoglobin 14.6 13.0 - 17.0 [name]/dL   HCT 35.5 97.4 - 16.3 %   MCV 88.8 78.0 - 100.0 fl   MCHC 34.2 30.0 - 36.0 [name]/dL   RDW 84.5 36.4 - 68.0 %   Platelets 236.0 150.0 - 400.0 K/uL   Neutrophils Relative % 50.9 43.0 - 77.0 %   Lymphocytes Relative 34.8 12.0 - 46.0 %   Monocytes Relative 9.4 3.0 - 12.0 %   Eosinophils Relative 4.0 0.0 - 5.0 %   Basophils Relative 0.9 0.0 - 3.0 %   Neutro Abs 3.3 1.4 - 7.7 K/uL   Lymphs Abs 2.3 0.7 - 4.0 K/uL   Monocytes Absolute 0.6 0.1 - 1.0 K/uL   Eosinophils Absolute 0.3 0.0 - 0.7 K/uL   Basophils Absolute 0.1 0.0 - 0.1 K/uL  Lipid panel  Result Value Ref Range   Cholesterol 100 0 - 200 mg/dL   Triglycerides 321.2 (H) 0.0 - 149.0 mg/dL   HDL 24.82 (L) >50.03 mg/dL   VLDL 70.4 (H) 0.0 - 88.8 mg/dL   Total CHOL/HDL Ratio 4    NonHDL 71.85   LDL cholesterol, direct  Result Value Ref Range   Direct LDL 55.0 mg/dL    Assessment and Plan:     ICD-10-CM   1. Healthcare maintenance  Z00.00   2. Need for influenza vaccination  Z23 Flu Vaccine QUAD 6+ mos PF IM (Fluarix Quad PF)  3. Diabetes mellitus without complication (HCC)  E11.9 Ambulatory referral to diabetic education    Pneumococcal polysaccharide vaccine 23-valent greater than or equal to 2yo subcutaneous/IM  4. Need for prophylactic vaccination against Streptococcus pneumoniae (pneumococcus)  Z23 Pneumococcal polysaccharide vaccine 23-valent greater than or equal to 2yo subcutaneous/IM   Globally he is doing reasonably well.  He continues to gain weight, and I told him to lose weight at all cost, this would have a significant impact on his new onset diabetes as well.  Pneumovax 23 today.  >10 minutes spent in face to face time with patient, >50% spent in counselling or coordination of care: Additional counseling entirely for type 2 diabetes, its explanation basically, and  I am also can have him see diabetic education.  Start him on Metformin 500 mg extended release in the morning.  Follow-up in 3 months.  Health Maintenance Exam: The patient's preventative maintenance and recommended screening tests for an annual wellness exam were reviewed in full today. Brought up to date unless services declined.  Counselled on the importance of diet, exercise, and its role in overall health and mortality. The patient's FH and SH was reviewed, including their home life, tobacco status, and drug and alcohol status.  Follow-up in 1 year for physical exam or additional follow-up below.  Follow-up: No follow-ups on file. Or follow-up in 1 year if not noted.  Meds ordered this encounter  Medications  . metFORMIN (GLUCOPHAGE-XR) 500 MG 24 hr tablet    Sig: Take 1 tablet (500 mg total) by mouth daily with breakfast.    Dispense:  90 tablet    Refill:  1   Medications Discontinued During This Encounter  Medication Reason  . amitriptyline (ELAVIL) 25 MG tablet Duplicate  . mometasone (ELOCON) 0.1 % ointment Duplicate   Orders Placed This Encounter  Procedures  . Flu Vaccine QUAD 6+ mos PF IM (Fluarix Quad PF)  . Pneumococcal polysaccharide vaccine 23-valent greater than or equal to 2yo subcutaneous/IM  . Ambulatory referral to diabetic education    Signed,  Frederico Hamman T. Abayomi Pattison, MD   Allergies as of 03/15/2019   No Known Allergies     Medication List       Accurate as of March 15, 2019  9:45 AM. If you have any questions, ask your nurse or doctor.        acetaminophen 500 MG tablet Commonly known as: TYLENOL Take 500 mg by mouth every 6 (six) hours as needed.   amitriptyline 25 MG tablet Commonly known as: ELAVIL Take 25 mg by mouth at bedtime as needed for sleep.   aspirin 81 MG tablet Take 81 mg by mouth daily.   escitalopram 10 MG tablet Commonly known as: LEXAPRO TAKE 2 TABLETS AT BEDTIME   lisinopril 20 MG tablet Commonly known as:  ZESTRIL Take 1 tablet (20 mg total) by mouth daily.   meloxicam 15 MG tablet Commonly known as: MOBIC Take one tablet by mouth every morning with food.   metFORMIN 500 MG 24 hr tablet Commonly known as: GLUCOPHAGE-XR Take 1 tablet (500 mg total) by mouth daily with breakfast. Started by: Owens Loffler, MD   mometasone 0.1 % ointment Commonly known as: ELOCON Apply 1 application topically 2 (two) times daily as needed.   multivitamin with minerals tablet Take 1 tablet by mouth daily.   rosuvastatin 40 MG tablet Commonly known as: CRESTOR TAKE 1 TABLET DAILY   tamsulosin 0.4 MG Caps capsule Commonly known as: FLOMAX Take 1 capsule (0.4 mg total) by mouth daily.   vitamin C 1000 MG tablet Take 1,000 mg by mouth daily.

## 2019-03-15 ENCOUNTER — Encounter: Payer: Self-pay | Admitting: Family Medicine

## 2019-03-15 ENCOUNTER — Other Ambulatory Visit: Payer: Self-pay

## 2019-03-15 ENCOUNTER — Ambulatory Visit (INDEPENDENT_AMBULATORY_CARE_PROVIDER_SITE_OTHER): Admitting: Family Medicine

## 2019-03-15 VITALS — BP 130/70 | HR 82 | Temp 98.9°F | Ht 65.25 in | Wt 305.5 lb

## 2019-03-15 DIAGNOSIS — E119 Type 2 diabetes mellitus without complications: Secondary | ICD-10-CM

## 2019-03-15 DIAGNOSIS — Z23 Encounter for immunization: Secondary | ICD-10-CM | POA: Diagnosis not present

## 2019-03-15 DIAGNOSIS — Z0000 Encounter for general adult medical examination without abnormal findings: Secondary | ICD-10-CM

## 2019-03-15 MED ORDER — METFORMIN HCL ER 500 MG PO TB24: 500.0000 mg | ORAL_TABLET | Freq: Every day | ORAL | 1 refills | Status: DC

## 2019-04-04 ENCOUNTER — Encounter: Payer: Self-pay | Admitting: Dietician

## 2019-04-04 ENCOUNTER — Encounter: Attending: Family Medicine | Admitting: Dietician

## 2019-04-04 ENCOUNTER — Other Ambulatory Visit: Payer: Self-pay

## 2019-04-04 VITALS — BP 128/80 | Ht 64.5 in | Wt 302.9 lb

## 2019-04-04 DIAGNOSIS — Z713 Dietary counseling and surveillance: Secondary | ICD-10-CM | POA: Insufficient documentation

## 2019-04-04 DIAGNOSIS — E119 Type 2 diabetes mellitus without complications: Secondary | ICD-10-CM | POA: Diagnosis not present

## 2019-04-04 NOTE — Patient Instructions (Addendum)
  Check blood sugars 2 x day before breakfast and 2 hrs after supper every day  Bring blood sugar records to the next appointment/class  Call your doctor for a prescription for:  1. Meter strips (type) Contour Next test strips     checking  2 times per day  2. Lancets (type) Microlet lancets                  checking  2  times per day  Call [employer] for preferred meter  Exercise:  Increase walking to 30-45 min. 4x/wk  Eat 3 meals day,   2   snacks a day in afternoon and at bedtime  Space meals 4-6 hours apart  Eat 3 carbohydrate servings/meal + protein  Eat 1 carbohydrate serving/snack + protein  Avoid sugar sweetened drinks (soda, tea, coffee, sports drinks, juices)  Drink plenty of water  Limit intake of sweets, fried foods and snack foods  Make  dentist / eye doctor appointments  Get a Sharps container  Return for appointment/classes on:  05-01-19

## 2019-04-04 NOTE — Progress Notes (Signed)
Diabetes Self-Management Education  Visit Type: First/Initial  Appt. Start Time: 1515 Appt. End Time: 2637  04/04/2019  [name], identified by name and date of birth, is a 58 y.o. male with a diagnosis of Diabetes: Type 2.   ASSESSMENT  Blood pressure 128/80, height 5' 4.5" (1.638 m), weight (!) 302 lb 14.4 oz (137.4 kg). Body mass index is 51.19 kg/m.  Diabetes Self-Management Education - 04/04/19 1634      Visit Information   Visit Type  First/Initial      Initial Visit   Diabetes Type  Type 2      Health Coping   How would you rate your overall health?  Good      Psychosocial Assessment   Patient Belief/Attitude about Diabetes  Motivated to manage diabetes    Self-management support  Doctor's office;Family    Other persons present  Patient    Patient Concerns  Glycemic Control;Weight Control;Healthy Lifestyle   become more fit   Special Needs  None    Preferred Learning Style  Auditory;Hands on    Learning Readiness  Ready      Pre-Education Assessment   Patient understands the diabetes disease and treatment process.  Needs Instruction    Patient understands incorporating nutritional management into lifestyle.  Needs Instruction    Patient undertands incorporating physical activity into lifestyle.  Needs Instruction    Patient understands using medications safely.  Needs Instruction    Patient understands monitoring blood glucose, interpreting and using results  Needs Instruction    Patient understands prevention, detection, and treatment of acute complications.  Needs Instruction    Patient understands prevention, detection, and treatment of chronic complications.  Needs Instruction    Patient understands how to develop strategies to address psychosocial issues.  Needs Instruction    Patient understands how to develop strategies to promote health/change behavior.  Needs Instruction      Complications   Last HgB A1C per patient/outside source  7.6 %    03-10-19   How often do you check your blood sugar?  0 times/day (not testing)    Have you had a dilated eye exam in the past 12 months?  No   12-2015   Have you had a dental exam in the past 12 months?  No   10-2014   Are you checking your feet?  No      Dietary Intake   Breakfast  Eats breakfast at 9-9:30a=usually eats egg, bacon, toast, fruit juice    Lunch  eats lunch at 12:30-1p    Snack (afternoon)  eats occasional sugar free cookie or fruit    Dinner  eats supper at 6-6:30p    Snack (evening)  eats occasional snack at 9-10p=fruit    Beverage(s)  drinks sweet/regular soda 2-3x/day, occasional diet soda, fruit juice 0-1/day and chocolate milk 1x/day      Exercise   Exercise Type  Light (walking / raking leaves)    How many days per week to you exercise?  2.5    How many minutes per day do you exercise?  37.5    Total minutes per week of exercise  93.75      Patient Education   Previous Diabetes Education  No    Disease state   Explored patient's options for treatment of their diabetes;Definition of diabetes, type 1 and 2, and the diagnosis of diabetes    Nutrition management   Role of diet in the treatment of diabetes and the relationship  between the three main macronutrients and blood glucose level;Food label reading, portion sizes and measuring food.;Carbohydrate counting    Physical activity and exercise   Role of exercise on diabetes management, blood pressure control and cardiac health.;Helped patient identify appropriate exercises in relation to his/her diabetes, diabetes complications and other health issue.    Medications  Reviewed patients medication for diabetes, action, purpose, timing of dose and side effects.    Monitoring  Taught/evaluated SMBG meter.;Purpose and frequency of SMBG.;Taught/discussed recording of test results and interpretation of SMBG.;Identified appropriate SMBG and/or A1C goals.;Yearly dilated eye exam   gave pt Contour Next meter and instructed on  its use-BG 79 ac supper   Chronic complications  Relationship between chronic complications and blood glucose control;Dental care;Retinopathy and reason for yearly dilated eye exams    Personal strategies to promote health  Lifestyle issues that need to be addressed for better diabetes care;Helped patient develop diabetes management plan for (enter comment)      Outcomes   Expected Outcomes  Demonstrated interest in learning. Expect positive outcomes       Individualized Plan for Diabetes Self-Management Training:   Learning Objective:  Patient will have a greater understanding of diabetes self-management. Patient education plan is to attend individual and/or group sessions per assessed needs and concerns.   Plan:   Patient Instructions   Check blood sugars 2 x day before breakfast and 2 hrs after supper every day  Bring blood sugar records to the next appointment/class  Call your doctor for a prescription for:  1. Meter strips (type) Contour Next test strips     checking  2 times per day  2. Lancets (type) Microlet lancets                  checking  2  times per day  Call [employer] for preferred meter  Exercise:  Increase walking to 30-45 min. 4x/wk  Eat 3 meals day,   2   snacks a day in afternoon and at bedtime  Space meals 4-6 hours apart  Eat 3 carbohydrate servings/meal + protein  Eat 1 carbohydrate serving/snack + protein  Avoid sugar sweetened drinks (soda, tea, coffee, sports drinks, juices)  Drink plenty of water  Limit intake of sweets, fried foods and snack foods  Make  dentist / eye doctor appointments  Get a Sharps container  Return for appointment/classes on:  05-01-19   Expected Outcomes:  Demonstrated interest in learning. Expect positive outcomes  Education material provided: General meal planning guidelines, Food Group handout, Contour Next meter   If problems or questions, patient to contact team via:  [phone]  Future DSME appointment:   05-01-19

## 2019-04-11 ENCOUNTER — Telehealth: Payer: Self-pay

## 2019-04-11 NOTE — Telephone Encounter (Signed)
Pt's wife (DPR signed) request diabetic testing meter, test strips and lancets. Advised pt's wife to call ins co to see what meter and supplies are covered by pts ins. pts wife voiced understanding and will cb with info. Need to get pharmacy as well.

## 2019-04-12 ENCOUNTER — Telehealth: Payer: Self-pay | Admitting: *Deleted

## 2019-04-12 DIAGNOSIS — E119 Type 2 diabetes mellitus without complications: Secondary | ICD-10-CM | POA: Insufficient documentation

## 2019-04-12 HISTORY — DX: Type 2 diabetes mellitus without complications: E11.9

## 2019-04-12 MED ORDER — FREESTYLE SYSTEM KIT: PACK | 0 refills | Status: DC

## 2019-04-12 MED ORDER — FREESTYLE LANCETS MISC: 3 refills | Status: DC

## 2019-04-12 MED ORDER — FREESTYLE TEST VI STRP: ORAL_STRIP | 3 refills | Status: DC

## 2019-04-12 NOTE — Telephone Encounter (Signed)
Patient's wife left a voicemail stating that they found out that [employer] will cover the Free style meter. Patient's wife stated that they would like a script for the meter, strips and lancets sent to [facility].

## 2019-04-12 NOTE — Telephone Encounter (Signed)
Freestyle meter, lancets and test strip prescriptions sent to [facility] as requested.

## 2019-04-17 ENCOUNTER — Ambulatory Visit (INDEPENDENT_AMBULATORY_CARE_PROVIDER_SITE_OTHER): Admitting: Family Medicine

## 2019-04-17 ENCOUNTER — Other Ambulatory Visit: Payer: Self-pay

## 2019-04-17 ENCOUNTER — Encounter: Payer: Self-pay | Admitting: Family Medicine

## 2019-04-17 VITALS — Temp 97.6°F | Ht 64.5 in | Wt 303.0 lb

## 2019-04-17 DIAGNOSIS — Z20822 Contact with and (suspected) exposure to covid-19: Secondary | ICD-10-CM

## 2019-04-17 DIAGNOSIS — Z20828 Contact with and (suspected) exposure to other viral communicable diseases: Secondary | ICD-10-CM

## 2019-04-17 NOTE — Progress Notes (Signed)
I connected with Kevin [name] on 04/17/19 at 10:40 AM EST by video and verified that I am speaking with the correct person using two identifiers.   I discussed the limitations, risks, security and privacy concerns of performing an evaluation and management service by video and the availability of in person appointments. I also discussed with the patient that there may be a patient responsible charge related to this service. The patient expressed understanding and agreed to proceed.  Patient location: Home Provider Location: [place] Participants: Kevin [name] and Kevin [name] and wife   Subjective:     Kevin [name] is a 58 y.o. male presenting for Fever (between 99.5-100.3, has taken some tylenol ), Sore Throat (severe, x friday ), Ear Pain (bilateral), Nasal Congestion (Not able to taste very well), and Excessive Sweating (last night )     Sore Throat  This is a new problem. The current episode started in the past 7 days. The problem has been gradually worsening. The maximum temperature recorded prior to his arrival was 100.4 - 100.9 F. The pain is severe. Associated symptoms include congestion, coughing, ear pain and trouble swallowing. Pertinent negatives include no diarrhea, shortness of breath or vomiting. He has tried acetaminophen and gargles (cold medicine) for the symptoms. The treatment provided no relief.   Sick contact: none that he knows of [place] was the last fever  Review of Systems  Constitutional: Positive for diaphoresis and fever.  HENT: Positive for congestion, ear pain, postnasal drip, sore throat and trouble swallowing. Negative for sinus pressure and sinus pain.   Respiratory: Positive for cough. Negative for shortness of breath.   Cardiovascular: Negative for chest pain.  Gastrointestinal: Negative for diarrhea, nausea and vomiting.  Musculoskeletal: Positive for myalgias. Negative for arthralgias.     Social History    Tobacco Use  Smoking Status Former Smoker  . Packs/day: 1.00  . Years: 15.00  . Pack years: 15.00  . Types: Cigarettes  . Quit date: 06/27/2018  . Years since quitting: 0.8  Smokeless Tobacco Never Used        Objective:   BP Readings from Last 3 Encounters:  04/04/19 128/80  03/15/19 130/70  01/04/19 (!) 158/84   Wt Readings from Last 3 Encounters:  04/17/19 (!) 303 lb (137.4 kg)  04/04/19 (!) 302 lb 14.4 oz (137.4 kg)  03/15/19 (!) 305 lb 8 oz (138.6 kg)    Temp 97.6 F (36.4 C)   Ht 5' 4.5" (1.638 m)   Wt (!) 303 lb (137.4 kg)   BMI 51.21 kg/m    Physical Exam Constitutional:      Appearance: Normal appearance. He is not ill-appearing.  HENT:     Head: Normocephalic and atraumatic.     Right Ear: External ear normal.     Left Ear: External ear normal.     Nose: Congestion present. No rhinorrhea.     Mouth/Throat:     Mouth: Mucous membranes are moist.     Comments: Unable to visualize the posterior oropharynx on virtual visit. Though wife notes "white spots" Eyes:     Conjunctiva/sclera: Conjunctivae normal.  Pulmonary:     Effort: Pulmonary effort is normal. No respiratory distress.  Neurological:     Mental Status: He is alert. Mental status is at baseline.  Psychiatric:        Mood and Affect: Mood normal.        Behavior: Behavior normal.  Thought Content: Thought content normal.        Judgment: Judgment normal.            Assessment & Plan:   Problem List Items Addressed This Visit    None    Visit Diagnoses    Suspected COVID-19 virus infection    -  Primary     Discussed OTC treatment for viral illness Given symptoms possible covid-19 and would recommend being tested ER precautions given Advised staying out of work and isolating until results have come back  Discussed that given cough, sinus discharge, ear pain with sore throat it is less likely strept throat.   Call back if symptoms do not resolve by next week and covid  negative.    No follow-ups on file.  Lesleigh Noe, MD

## 2019-04-18 LAB — NOVEL CORONAVIRUS, NAA: SARS-CoV-2, NAA: DETECTED — AB

## 2019-05-01 ENCOUNTER — Ambulatory Visit

## 2019-05-08 ENCOUNTER — Ambulatory Visit

## 2019-05-15 ENCOUNTER — Encounter: Payer: Self-pay | Admitting: Dietician

## 2019-05-15 ENCOUNTER — Ambulatory Visit

## 2019-05-15 NOTE — Progress Notes (Signed)
Called pt-no answer- left message for pt to call and reschedule diabetes classes

## 2019-05-15 NOTE — Progress Notes (Signed)
Pt called and cancelled diabetes class on 05-01-19 due to being sick with covid-19.

## 2019-05-17 ENCOUNTER — Other Ambulatory Visit: Payer: Self-pay | Admitting: *Deleted

## 2019-05-17 ENCOUNTER — Other Ambulatory Visit: Payer: Self-pay | Admitting: Family Medicine

## 2019-05-17 MED ORDER — AMITRIPTYLINE HCL 25 MG PO TABS: 25.0000 mg | ORAL_TABLET | Freq: Every evening | ORAL | 1 refills | Status: DC | PRN

## 2019-05-24 ENCOUNTER — Encounter: Payer: Self-pay | Admitting: Dietician

## 2019-05-24 NOTE — Progress Notes (Signed)
Have not heard from pt-called pt but no answer-left message for pt to call and reschedule diabetes classes

## 2019-05-29 ENCOUNTER — Encounter: Payer: Self-pay | Admitting: Dietician

## 2019-05-29 NOTE — Progress Notes (Signed)
Have not heard from pt 

## 2019-06-10 ENCOUNTER — Other Ambulatory Visit: Payer: Self-pay | Admitting: Family Medicine

## 2019-06-12 NOTE — Telephone Encounter (Signed)
Last office visit 04/17/2019 with Dr. Selena Batten for Suspected Covid 19.  Last refilled 02/08/2019 for #90 with no refills.  Next Appt: 06/14/2019 for 3 month follow up.

## 2019-06-14 ENCOUNTER — Other Ambulatory Visit: Payer: Self-pay

## 2019-06-14 ENCOUNTER — Encounter: Payer: Self-pay | Admitting: Family Medicine

## 2019-06-14 ENCOUNTER — Ambulatory Visit (INDEPENDENT_AMBULATORY_CARE_PROVIDER_SITE_OTHER): Admitting: Family Medicine

## 2019-06-14 VITALS — BP 110/62 | HR 92 | Temp 98.1°F | Ht 65.25 in | Wt 283.5 lb

## 2019-06-14 DIAGNOSIS — Z6841 Body Mass Index (BMI) 40.0 and over, adult: Secondary | ICD-10-CM

## 2019-06-14 DIAGNOSIS — E119 Type 2 diabetes mellitus without complications: Secondary | ICD-10-CM

## 2019-06-14 DIAGNOSIS — I10 Essential (primary) hypertension: Secondary | ICD-10-CM

## 2019-06-14 LAB — POCT GLYCOSYLATED HEMOGLOBIN (HGB A1C): Hemoglobin A1C: 6 % — AB (ref 4.0–5.6)

## 2019-06-14 NOTE — Progress Notes (Signed)
Kevin Keddy T. [name], MD Primary [facility] and [place] at [facility] [place], [address] Phone: [phone]  FAX: [phone]  [name] - 59 y.o. male  MRN [id number]  Date of Birth: 1961-02-07  Visit Date: 06/14/2019  PCP: Owens Loffler, MD  Referred by: Owens Loffler, MD  Chief Complaint  Patient presents with  . Diabetes    This visit occurred during the SARS-CoV-2 public health emergency.  Safety protocols were in place, including screening questions prior to the visit, additional usage of staff PPE, and extensive cleaning of exam room while observing appropriate contact time as indicated for disinfecting solutions.   Subjective:   Kevin [name] is a 59 y.o. very pleasant male patient who presents with the following:  Diabetes Mellitus: Tolerating Medications: yes Compliance with diet: fair, Body mass index is 46.82 kg/m. Exercise: minimal / intermittent Avg blood sugars at home: not checking Foot problems: none Hypoglycemia: none No nausea, vomitting, blurred vision, polyuria.  Lab Results  Component Value Date   HGBA1C 6.0 (A) 06/14/2019   HGBA1C 7.6 (H) 03/10/2019   Lab Results  Component Value Date   LDLCALC 92 01/14/2015   CREATININE 1.06 03/10/2019    Wt Readings from Last 3 Encounters:  06/14/19 283 lb 8 oz (128.6 kg)  04/17/19 (!) 303 lb (137.4 kg)  04/04/19 (!) 302 lb 14.4 oz (137.4 kg)    Off cigs for > 1 year No caffeine  Body mass index is 46.82 kg/m.   HTN: Tolerating all medications without side effects Stable and at goal No CP, no sob. No HA.  BP Readings from Last 3 Encounters:  06/14/19 110/62  04/04/19 128/80  03/15/19 888/28    Basic Metabolic Panel:    Component Value Date/Time   NA 138 03/10/2019 0847   K 4.0 03/10/2019 0847   CL 105 03/10/2019 0847   CO2 24 03/10/2019 0847   BUN 22 03/10/2019 0847   CREATININE 1.06 03/10/2019 0847   GLUCOSE  166 (H) 03/10/2019 0847   CALCIUM 9.5 03/10/2019 0847     Past Medical History, Surgical History, Social History, Family History, Problem List, Medications, and Allergies have been reviewed and updated if relevant.   GEN: No acute illnesses, no fevers, chills. GI: No n/v/d, eating normally Pulm: No SOB Interactive and getting along well at home.  Otherwise, ROS is as per the HPI.  Objective:   BP 110/62   Pulse 92   Temp 98.1 F (36.7 C) (Temporal)   Ht 5' 5.25" (1.657 m)   Wt 283 lb 8 oz (128.6 kg)   SpO2 95%   BMI 46.82 kg/m   GEN: WDWN, NAD, Non-toxic, A & O x 3 HEENT: Atraumatic, Normocephalic. Neck supple. No masses, No LAD. Ears and Nose: No external deformity. CV: RRR, No M/G/R. No JVD. No thrill. No extra heart sounds. PULM: CTA B, no wheezes, crackles, rhonchi. No retractions. No resp. distress. No accessory muscle use. EXTR: No c/c/e NEURO Normal gait.  PSYCH: Normally interactive. Conversant. Not depressed or anxious appearing.  Calm demeanor.   Laboratory and Imaging Data: Results for orders placed or performed in visit on 06/14/19  POCT glycosylated hemoglobin (Hb A1C)  Result Value Ref Range   Hemoglobin A1C 6.0 (A) 4.0 - 5.6 %   HbA1c POC (<> result, manual entry)     HbA1c, POC (prediabetic range)     HbA1c, POC (controlled diabetic range)  Assessment and Plan:     ICD-10-CM   1. Essential hypertension  I10   2. Diabetes mellitus without complication (HCC)  H08.6 POCT glycosylated hemoglobin (Hb A1C)  3. Morbid obesity with BMI of 50.0-59.9, adult (HCC)  E66.01    Z68.43    Doing great. 20 pound weight loss. No changes DM doing great  Follow-up: Return in about 6 months (around 12/12/2019).  No orders of the defined types were placed in this encounter.  Orders Placed This Encounter  Procedures  . POCT glycosylated hemoglobin (Hb A1C)    Signed,  Amica Harron T. Meghin Thivierge, MD   Outpatient Encounter Medications as of 06/14/2019    Medication Sig  . acetaminophen (TYLENOL) 500 MG tablet Take 500 mg by mouth every 6 (six) hours as needed.  Marland Kitchen amitriptyline (ELAVIL) 25 MG tablet Take 1 tablet (25 mg total) by mouth at bedtime as needed for sleep.  . Ascorbic Acid (VITAMIN C) 1000 MG tablet Take 1,000 mg by mouth daily.  Marland Kitchen aspirin 81 MG tablet Take 81 mg by mouth daily.    Marland Kitchen escitalopram (LEXAPRO) 10 MG tablet TAKE 2 TABLETS AT BEDTIME  . glucose blood (FREESTYLE TEST STRIPS) test strip Use to check blood sugar daily.  Marland Kitchen glucose monitoring kit (FREESTYLE) monitoring kit Use to check blood sugar daily.  . Lancets (FREESTYLE) lancets Use to check blood sugar daily.  Marland Kitchen lisinopril (ZESTRIL) 20 MG tablet TAKE 1 TABLET DAILY  . meloxicam (MOBIC) 15 MG tablet TAKE 1 TABLET EVERY MORNING WITH FOOD  . metFORMIN (GLUCOPHAGE-XR) 500 MG 24 hr tablet Take 1 tablet (500 mg total) by mouth daily with breakfast.  . mometasone (ELOCON) 0.1 % ointment Apply 1 application topically 2 (two) times daily as needed.  . Multiple Vitamins-Minerals (MULTIVITAMIN WITH MINERALS) tablet Take 1 tablet by mouth daily.  . rosuvastatin (CRESTOR) 40 MG tablet TAKE 1 TABLET DAILY  . tamsulosin (FLOMAX) 0.4 MG CAPS capsule Take 1 capsule (0.4 mg total) by mouth daily.   No facility-administered encounter medications on file as of 06/14/2019.

## 2019-08-17 ENCOUNTER — Ambulatory Visit: Attending: Internal Medicine

## 2019-08-21 ENCOUNTER — Other Ambulatory Visit: Payer: Self-pay | Admitting: Family Medicine

## 2019-08-31 ENCOUNTER — Telehealth: Payer: Self-pay | Admitting: Family Medicine

## 2019-08-31 NOTE — Telephone Encounter (Signed)
Patient's wife called today  They would like the patient referred over to [facility] ENT Patient has been having trouble with his hearing. She stated that it has been talked about at his physicals and they think it is best from him to be seen by ENT.   Please advise

## 2019-09-01 ENCOUNTER — Other Ambulatory Visit: Payer: Self-pay | Admitting: Family Medicine

## 2019-09-01 DIAGNOSIS — H833X9 Noise effects on inner ear, unspecified ear: Secondary | ICD-10-CM

## 2019-09-01 NOTE — Telephone Encounter (Signed)
Done

## 2019-09-20 LAB — HM DIABETES EYE EXAM

## 2019-10-03 ENCOUNTER — Encounter: Payer: Self-pay | Admitting: Family Medicine

## 2019-10-17 ENCOUNTER — Other Ambulatory Visit: Payer: Self-pay | Admitting: Urology

## 2019-10-29 ENCOUNTER — Emergency Department

## 2019-10-29 ENCOUNTER — Other Ambulatory Visit: Payer: Self-pay

## 2019-10-29 ENCOUNTER — Emergency Department
Admission: EM | Admit: 2019-10-29 | Discharge: 2019-10-29 | Disposition: A | Discharge status: Home / Self Care | Attending: Emergency Medicine | Admitting: Emergency Medicine

## 2019-10-29 DIAGNOSIS — W57XXXA Bitten or stung by nonvenomous insect and other nonvenomous arthropods, initial encounter: Secondary | ICD-10-CM | POA: Insufficient documentation

## 2019-10-29 DIAGNOSIS — M791 Myalgia, unspecified site: Secondary | ICD-10-CM | POA: Insufficient documentation

## 2019-10-29 DIAGNOSIS — S50861A Insect bite (nonvenomous) of right forearm, initial encounter: Secondary | ICD-10-CM

## 2019-10-29 DIAGNOSIS — R6883 Chills (without fever): Secondary | ICD-10-CM | POA: Diagnosis not present

## 2019-10-29 DIAGNOSIS — R000 Tachycardia, unspecified: Secondary | ICD-10-CM | POA: Diagnosis not present

## 2019-10-29 DIAGNOSIS — R519 Headache, unspecified: Secondary | ICD-10-CM | POA: Insufficient documentation

## 2019-10-29 MED ORDER — CEPHALEXIN 500 MG PO CAPS: 500.0000 mg | ORAL_CAPSULE | Freq: Three times a day (TID) | ORAL | 0 refills | Status: AC

## 2019-10-29 MED ORDER — CEPHALEXIN 500 MG PO CAPS
500.0000 mg | ORAL_CAPSULE | Freq: Once | ORAL | Status: AC
  Administered 2019-10-29: 500 mg via ORAL
  Filled 2019-10-29: qty 1

## 2019-10-29 MED ORDER — DIPHENHYDRAMINE HCL 25 MG PO CAPS
25.0000 mg | ORAL_CAPSULE | Freq: Once | ORAL | Status: AC
  Administered 2019-10-29: 25 mg via ORAL
  Filled 2019-10-29: qty 1

## 2019-10-29 NOTE — Discharge Instructions (Addendum)
Your exam and CXR are normal at this time. You are being treated for an insect bite that may be infected. Apply OTC hydrocortisone cream as directed. Take the antibiotic as needed and follow-up with your provider. Return as needed.

## 2019-10-29 NOTE — ED Triage Notes (Signed)
Pt comes POV with h/a, chills, earache, body ache for about a week. Pt has taken temp at home and not had a fever.

## 2019-10-30 NOTE — ED Provider Notes (Signed)
[facility] Emergency Department Provider Note ____________________________________________  Time seen: 2213  I have reviewed the triage vital signs and the nursing notes.  HISTORY  Chief Complaint  Chills and Generalized Body Aches  HPI Kevin [name] is a 59 y.o. male presents to the ED from home with complaints of headache, intermittent chills, and earache.  Patient is also had some body aches for about a week.  He is taking his temp at home has not recorded a fever.  Denies any sick contacts, recent travel, or other exposures.  He does note a symptom onset after he apparently had an insect bite to his right forearm.  He presents with area that is red, raised, and warm to touch.  He also notes some intermittent itching.  Patient denies any chest pain, shortness of breath, cough, congestion, abdominal pain.   Past Medical History:  Diagnosis Date  . Hyperlipidemia   . Hypertension   . Obesity     Patient Active Problem List   Diagnosis Date Noted  . Diabetes mellitus without complication ([place]) 53/29/9242  . Hematuria 10/14/2018  . Groin mass 09/16/2018  . 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 Tobacco dependence 07/28/2018  . OSA on CPAP 04/27/2016  . Generalized anxiety disorder 10/01/2014  . 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 Morbid obesity with BMI of 50.0-59.9, adult ([place]) 08/31/2013  . SLEEP APNEA 06/16/2010  . HYPERCHOLESTEROLEMIA 12/31/2008  . 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 Essential hypertension 12/31/2008    History reviewed. No pertinent surgical history.  Prior to Admission medications   Medication Sig Start Date End Date Taking? Authorizing Provider  acetaminophen (TYLENOL) 500 MG tablet Take 500 mg by mouth every 6 (six) hours as needed.    [provider]  amitriptyline (ELAVIL) 25 MG tablet Take 1 tablet (25 mg total) by mouth at bedtime as needed for sleep. 05/17/19   Copland, Frederico Hamman, MD  Ascorbic  Acid (VITAMIN C) 1000 MG tablet Take 1,000 mg by mouth daily.    [provider]  aspirin 81 MG tablet Take 81 mg by mouth daily.      [provider]  cephALEXin (KEFLEX) 500 MG capsule Take 1 capsule (500 mg total) by mouth 3 (three) times daily for 7 days. 10/29/19 11/05/19  Deborh Pense, Dannielle Karvonen, PA-C  escitalopram (LEXAPRO) 10 MG tablet TAKE 2 TABLETS AT BEDTIME 12/22/18   Dohmeier, Asencion Partridge, MD  glucose blood (FREESTYLE TEST STRIPS) test strip Use to check blood sugar daily. 04/12/19   Copland, Frederico Hamman, MD  glucose monitoring kit (FREESTYLE) monitoring kit Use to check blood sugar daily. 04/12/19   Copland, Frederico Hamman, MD  Lancets (FREESTYLE) lancets Use to check blood sugar daily. 04/12/19   Copland, Frederico Hamman, MD  lisinopril (ZESTRIL) 20 MG tablet TAKE 1 TABLET DAILY 06/12/19   Copland, Frederico Hamman, MD  meloxicam (MOBIC) 15 MG tablet TAKE 1 TABLET EVERY MORNING WITH FOOD 06/12/19   Copland, Frederico Hamman, MD  metFORMIN (GLUCOPHAGE-XR) 500 MG 24 hr tablet TAKE 1 TABLET DAILY WITH BREAKFAST 08/21/19   Copland, Frederico Hamman, MD  mometasone (ELOCON) 0.1 % ointment Apply 1 application topically 2 (two) times daily as needed.    [provider]  Multiple Vitamins-Minerals (MULTIVITAMIN WITH MINERALS) tablet Take 1 tablet by mouth daily.    [provider]  rosuvastatin (CRESTOR) 40 MG tablet TAKE 1 TABLET DAILY 05/17/19   Copland, Frederico Hamman, MD  tamsulosin (FLOMAX) 0.4 MG CAPS capsule TAKE 1 CAPSULE DAILY 10/17/19   Billey Co,  MD    Allergies Patient has no known allergies.  Family History  Problem Relation Age of Onset  . Heart attack Mother   . Coronary artery disease Mother   . COPD Father   . Diabetes Father     Social History Social History   Tobacco Use  . Smoking status: Former Smoker    Packs/day: 1.00    Years: 15.00    Pack years: 15.00    Types: Cigarettes    Quit date: 06/27/2018    Years since quitting: 1.3  . Smokeless tobacco: Never Used  Substance  Use Topics  . Alcohol use: No    Alcohol/week: 0.0 standard drinks  . Drug use: No    Review of Systems  Constitutional: Negative for fever. Eyes: Negative for visual changes. ENT: Negative for sore throat. Respiratory: Negative for shortness of breath. Genitourinary: Negative for dysuria. Musculoskeletal: Negative for back pain. Skin: Negative for rash. Insect bite RUE. Neurological: Negative for headaches, focal weakness or numbness. ____________________________________________  PHYSICAL EXAM:  VITAL SIGNS: ED Triage Vitals  Enc Vitals Group     BP 10/29/19 2105 134/71     Pulse Rate 10/29/19 2105 (!) 118     Resp 10/29/19 2105 18     Temp 10/29/19 2105 99.3 F (37.4 C)     Temp Source 10/29/19 2105 Oral     SpO2 10/29/19 2105 99 %     Weight 10/29/19 2106 285 lb (129.3 kg)     Height 10/29/19 2106 5' 5"  (1.651 m)     Head Circumference --      Peak Flow --      Pain Score 10/29/19 2106 5     Pain Loc --      Pain Edu? --      Excl. in [place]? --     Constitutional: Alert and oriented. Well appearing and in no distress. Head: Normocephalic and atraumatic. Eyes: Conjunctivae are normal. Normal extraocular movements Ears: Canals clear. TMs intact bilaterally. Nose: No congestion/rhinorrhea/epistaxis. Mouth/Throat: Mucous membranes are moist. Neck: Supple. No thyromegaly. Cardiovascular: Normal rate, regular rhythm. Normal distal pulses. Respiratory: Normal respiratory effort. No wheezes/rales/rhonchi. Gastrointestinal: Soft and nontender. No distention. Musculoskeletal: Nontender with normal range of motion in all extremities.  Neurologic:  Normal gait without ataxia. Normal speech and language. No gross focal neurologic deficits are appreciated. Skin:  Skin is warm, dry and intact. No rash noted.  Patient with a area to the dorsal right forearm that shows some mild erythema, induration, and warmth.  Area is without spontaneous drainage, vesicular changes, or  purulence. ____________________________________________   RADIOLOGY  CXR  IMPRESSION: No active cardiopulmonary disease. ____________________________________________  PROCEDURES  Keflex 500 mg PO Benadryl 25 mg PO  Procedures ____________________________________________  INITIAL IMPRESSION / ASSESSMENT AND PLAN / ED COURSE  Patient with ED evaluation of generalized chills and body ache for about a week.  Patient is reassured by his negative chest x-ray.  He does have a concurrent area to the right forearm consistent with an insect bite.  Patient be treated empirically with Keflex and Benadryl at this time.  He will keep the area clean, dry, and covered.  Follow-up with primary provider return to ED as needed.  Return precautions have been reviewed.  Kevin [name] was evaluated in Emergency Department on 11/03/2019 for the symptoms described in the history of present illness. He was evaluated in the context of the global COVID-19 pandemic, which necessitated consideration that the patient might be at risk  for infection with the SARS-CoV-2 virus that causes COVID-19. Institutional protocols and algorithms that pertain to the evaluation of patients at risk for COVID-19 are in a state of rapid change based on information released by regulatory bodies including the CDC and federal and state organizations. These policies and algorithms were followed during the patient's care in the ED. ____________________________________________  FINAL CLINICAL IMPRESSION(S) / ED DIAGNOSES  Final diagnoses:  Insect bite of right forearm, initial encounter      Melvenia Needles, PA-C 11/03/19 1505    Nena Polio, MD 11/03/19 2028

## 2019-11-06 ENCOUNTER — Ambulatory Visit (INDEPENDENT_AMBULATORY_CARE_PROVIDER_SITE_OTHER): Admitting: Adult Health

## 2019-11-06 ENCOUNTER — Encounter: Payer: Self-pay | Admitting: Adult Health

## 2019-11-06 VITALS — BP 149/75 | HR 76 | Ht 64.0 in | Wt 294.0 lb

## 2019-11-06 DIAGNOSIS — G4733 Obstructive sleep apnea (adult) (pediatric): Secondary | ICD-10-CM | POA: Diagnosis not present

## 2019-11-06 DIAGNOSIS — Z9989 Dependence on other enabling machines and devices: Secondary | ICD-10-CM | POA: Diagnosis not present

## 2019-11-06 NOTE — Progress Notes (Signed)
PATIENT: Kevin [name] DOB: 1960/09/29  REASON FOR VISIT: follow up HISTORY FROM: patient  HISTORY OF PRESENT ILLNESS: Today 11/06/19:  Kevin [name] is a 59 year old male with a history of obstructive sleep apnea on CPAP.  His download indicates that he uses machine nightly for compliance of 100%.  He uses machine greater than 4 hours each night.  On average he uses his machine 8 hours and 30 minutes.  His residual AHI is 0.8 on 8 to 20 cm of water with EPR 3.  Leak in the 95th percentile is 18.6 L/min. He returns today for follow-up.   HISTORY 11/02/18:  Kevin [name] is a 59 year old male with a history of obstructive sleep apnea on CPAP and hypoxemia.  His download indicates that he uses machine nightly for compliance of 100%.  He uses machine greater than 4 hours each night.  On average he uses his machine 8 hours and 41 minutes.  His residual AHI is 1.8 on 8-20 cm of water with EPR 3.  His leak in the 95th percentile is 47.5 L/min.  He does report that he feels his mask leaking at night.  He does change out his supplies regularly.  He joins me today for virtual visit.  REVIEW OF SYSTEMS: Out of a complete 14 system review of symptoms, the patient complains only of the following symptoms, and all other reviewed systems are negative.  FSS  30 ESS 10  ALLERGIES: No Known Allergies  HOME MEDICATIONS: Outpatient Medications Prior to Visit  Medication Sig Dispense Refill  . acetaminophen (TYLENOL) 500 MG tablet Take 500 mg by mouth every 6 (six) hours as needed.    Marland Kitchen amitriptyline (ELAVIL) 25 MG tablet Take 1 tablet (25 mg total) by mouth at bedtime as needed for sleep. 90 tablet 1  . Ascorbic Acid (VITAMIN C) 1000 MG tablet Take 1,000 mg by mouth daily.    Marland Kitchen aspirin 81 MG tablet Take 81 mg by mouth daily.      Marland Kitchen escitalopram (LEXAPRO) 10 MG tablet TAKE 2 TABLETS AT BEDTIME 180 tablet 3  . lisinopril (ZESTRIL) 20 MG tablet TAKE 1 TABLET DAILY 90 tablet 1  . metFORMIN  (GLUCOPHAGE-XR) 500 MG 24 hr tablet TAKE 1 TABLET DAILY WITH BREAKFAST 90 tablet 3  . mometasone (ELOCON) 0.1 % ointment Apply 1 application topically 2 (two) times daily as needed.    . Multiple Vitamins-Minerals (MULTIVITAMIN WITH MINERALS) tablet Take 1 tablet by mouth daily.    . rosuvastatin (CRESTOR) 40 MG tablet TAKE 1 TABLET DAILY 90 tablet 3  . tamsulosin (FLOMAX) 0.4 MG CAPS capsule TAKE 1 CAPSULE DAILY 90 capsule 3  . glucose blood (FREESTYLE TEST STRIPS) test strip Use to check blood sugar daily. 100 each 3  . glucose monitoring kit (FREESTYLE) monitoring kit Use to check blood sugar daily. 1 each 0  . Lancets (FREESTYLE) lancets Use to check blood sugar daily. 100 each 3  . meloxicam (MOBIC) 15 MG tablet TAKE 1 TABLET EVERY MORNING WITH FOOD 90 tablet 3   No facility-administered medications prior to visit.    PAST MEDICAL HISTORY: Past Medical History:  Diagnosis Date  . Hyperlipidemia   . Hypertension   . Obesity     PAST SURGICAL HISTORY: No past surgical history on file.  FAMILY HISTORY: Family History  Problem Relation Age of Onset  . Heart attack Mother   . Coronary artery disease Mother   . COPD Father   . Diabetes Father  SOCIAL HISTORY: Social History   Socioeconomic History  . Marital status: Married    Spouse name: Jackelyn Poling  . Number of children: Not on file  . Years of education: Not on file  . Highest education level: Not on file  Occupational History  . Occupation: MILITARY    Employer: [employer]    Employer: [employer]    Employer: [employer]: (bought out [employer])  Tobacco Use  . Smoking status: Former Smoker    Packs/day: 1.00    Years: 15.00    Pack years: 15.00    Types: Cigarettes    Quit date: 06/27/2018    Years since quitting: 1.3  . Smokeless tobacco: Never Used  Substance and Sexual Activity  . Alcohol use: No    Alcohol/week: 0.0 standard drinks  . Drug use: No  . Sexual activity: Not on file  Other Topics  Concern  . Not on file  Social History Narrative   Former [employer]-2009, [employer]   Caffeine 4-5 cups daily avg.   Married no kids.    [employer], now [employer]   Social Determinants of Health   Financial Resource Strain:   . Difficulty of Paying Living Expenses:   Food Insecurity:   . Worried About Charity fundraiser in the Last Year:   . Arboriculturist in the Last Year:   Transportation Needs:   . Film/video editor (Medical):   Marland Kitchen Lack of Transportation (Non-Medical):   Physical Activity:   . Days of Exercise per Week:   . Minutes of Exercise per Session:   Stress:   . Feeling of Stress :   Social Connections:   . Frequency of Communication with Friends and Family:   . Frequency of Social Gatherings with Friends and Family:   . Attends Religious Services:   . Active Member of Clubs or Organizations:   . Attends Archivist Meetings:   Marland Kitchen Marital Status:   Intimate Partner Violence:   . Fear of Current or Ex-Partner:   . Emotionally Abused:   Marland Kitchen Physically Abused:   . Sexually Abused:       PHYSICAL EXAM  Vitals:   11/06/19 0740  BP: (!) 149/75  Pulse: 76  Weight: 294 lb (133.4 kg)  Height: 5' 4"  (1.626 m)   Body mass index is 50.46 kg/m.  Generalized: Well developed, in no acute distress  Chest: Lungs clear to auscultation bilaterally  Neurological examination  Mentation: Alert oriented to time, place, history taking. Follows all commands speech and language fluent Cranial nerve II-XII: Extraocular movements were full, visual field were full on confrontational test Head turning and shoulder shrug  were normal and symmetric. Motor: The motor testing reveals 5 over 5 strength of all 4 extremities. Good symmetric motor tone is noted throughout.  Sensory: Sensory testing is intact to soft touch on all 4 extremities. No evidence of extinction is noted.  Gait and station: Gait is normal.    DIAGNOSTIC DATA (LABS, IMAGING, TESTING) - I reviewed patient records,  labs, notes, testing and imaging myself where available.  Lab Results  Component Value Date   WBC 6.5 03/10/2019   HGB 14.6 03/10/2019   HCT 42.7 03/10/2019   MCV 88.8 03/10/2019   PLT 236.0 03/10/2019      Component Value Date/Time   NA 138 03/10/2019 0847   K 4.0 03/10/2019 0847   CL 105 03/10/2019 0847   CO2 24 03/10/2019 0847   GLUCOSE  166 (H) 03/10/2019 0847   BUN 22 03/10/2019 0847   CREATININE 1.06 03/10/2019 0847   CALCIUM 9.5 03/10/2019 0847   PROT 6.9 03/10/2019 0847   ALBUMIN 4.1 03/10/2019 0847   AST 38 (H) 03/10/2019 0847   ALT 65 (H) 03/10/2019 0847   ALKPHOS 64 03/10/2019 0847   BILITOT 0.4 03/10/2019 0847   GFRNONAA 110.09 12/31/2008 0936   Lab Results  Component Value Date   CHOL 100 03/10/2019   HDL 28.50 (L) 03/10/2019   LDLCALC 92 01/14/2015   LDLDIRECT 55.0 03/10/2019   TRIG 218.0 (H) 03/10/2019   CHOLHDL 4 03/10/2019   Lab Results  Component Value Date   HGBA1C 6.0 (A) 06/14/2019      ASSESSMENT AND PLAN 58 y.o. year old male  has a past medical history of Hyperlipidemia, Hypertension, and Obesity. here with:  1. OSA on CPAP  - CPAP compliance excellent - Good treatment of AHI  - Encourage patient to use CPAP nightly and > 4 hours each night - F/U in 1 year or sooner if needed   I spent 20 minutes of face-to-face and non-face-to-face time with patient.  This included previsit chart review, lab review, study review, order entry, electronic health record documentation, patient education.  Ward Givens, MSN, NP-C 11/06/2019, 8:07 AM [facility] [address] [place], [place] [address] [phone]

## 2019-11-21 ENCOUNTER — Other Ambulatory Visit: Payer: Self-pay | Admitting: Family Medicine

## 2020-01-12 ENCOUNTER — Other Ambulatory Visit: Payer: Self-pay | Admitting: Family Medicine

## 2020-01-31 ENCOUNTER — Other Ambulatory Visit: Payer: Self-pay

## 2020-01-31 ENCOUNTER — Telehealth (INDEPENDENT_AMBULATORY_CARE_PROVIDER_SITE_OTHER): Admitting: Family Medicine

## 2020-01-31 ENCOUNTER — Encounter: Payer: Self-pay | Admitting: Family Medicine

## 2020-01-31 VITALS — BP 117/82 | HR 91 | Temp 97.0°F | Resp 20 | Ht 65.0 in

## 2020-01-31 DIAGNOSIS — L03119 Cellulitis of unspecified part of limb: Secondary | ICD-10-CM | POA: Diagnosis not present

## 2020-01-31 DIAGNOSIS — R5383 Other fatigue: Secondary | ICD-10-CM

## 2020-01-31 DIAGNOSIS — R6883 Chills (without fever): Secondary | ICD-10-CM | POA: Diagnosis not present

## 2020-01-31 MED ORDER — CEPHALEXIN 500 MG PO CAPS: 1000.0000 mg | ORAL_CAPSULE | Freq: Two times a day (BID) | ORAL | 0 refills | Status: AC

## 2020-01-31 NOTE — Progress Notes (Signed)
Kevin [name] T. Kevin Areola, MD Primary Care and Sports Medicine [facility] at [facility] [address] [place], [address] Phone: [phone]  FAX: [phone]  [name] - 59 y.o. male  MRN [id number]  Date of Birth: 11/15/1960  Visit Date: 01/31/2020  PCP: Kevin Beat, MD  Referred by: Kevin Beat, MD Chief Complaint  Patient presents with  . Headache    x 3 days  . Dizziness    denies N/V/D  . Rash    right elbow...seen at ER, prescribed medication but not helping.   Virtual Visit via Video Note:  I connected with  Kevin [name] on 01/31/2020  9:40 AM EDT by a video enabled telemedicine application and verified that I am speaking with the correct person using two identifiers.   Location patient: home computer, tablet, or smartphone Location provider: work or home office Consent: Verbal consent directly obtained from [employer]. Persons participating in the virtual visit: patient, provider  I discussed the limitations of evaluation and management by telemedicine and the availability of in person appointments. The patient expressed understanding and agreed to proceed.  History of Present Illness:  He is a pleasant gentleman who presents with 3 days of some ongoing illness.  He has been fatigued and initially for about the first 36 hours he was having a lot of chills and overall feeling unwell.  He does not have a fever.  He has had a headache, earache and some dizziness sensation.  He did have a decreased appetite.  He has not had any nausea, vomiting, diarrhea.  Generally has been sleeping a lot.  He also has had a red area that is approximately the size of a baseball on his arm visualized through video.  He does not describe any kind of neurological changes.  Immunization History  Administered Date(s) Administered  . Influenza,inj,Quad PF,6+ Mos 01/16/2015, 02/01/2017, 02/09/2018, 03/15/2019  . Pneumococcal  Polysaccharide-23 03/15/2019  . Td 10/23/2005  . Tdap 02/01/2017     Review of Systems as above: See pertinent positives and pertinent negatives per HPI No acute distress verbally   Observations/Objective/Exam:  An attempt was made to discern vital signs over the phone and per patient if applicable and possible.   General:    Alert, Oriented, appears well and in no acute distress  Pulmonary:     On inspection no signs of respiratory distress.  Psych / Neurological:     Pleasant and cooperative.  Assessment and Plan:    ICD-10-CM   1. Cellulitis of upper extremity, unspecified laterality  L03.119   2. Chills  R68.83   3. Fatigue, unspecified type  R53.83    I think that he definitely needs to be tested for COVID-19.  He has been vaccinated, but he does know to quarantine until his test results return.  With he does have some redness at the right arm, I do think it is appropriate to cover him with some antibiotics.  There is also some warmth per his report.  He understands that this dramatically worsens, then he may need a higher level of care.  I discussed the assessment and treatment plan with the patient. The patient was provided an opportunity to ask questions and all were answered. The patient agreed with the plan and demonstrated an understanding of the instructions.   The patient was advised to call back or seek an in-person evaluation if the symptoms worsen or if the condition fails to improve as  anticipated.  Follow-up: prn unless noted otherwise below No follow-ups on file.  Meds ordered this encounter  Medications  . cephALEXin (KEFLEX) 500 MG capsule    Sig: Take 2 capsules (1,000 mg total) by mouth 2 (two) times daily for 10 days.    Dispense:  40 capsule    Refill:  0   No orders of the defined types were placed in this encounter.   Signed,  Kevin [name]. Kevin Ealy, MD

## 2020-03-11 ENCOUNTER — Other Ambulatory Visit: Payer: Self-pay | Admitting: Family Medicine

## 2020-03-11 DIAGNOSIS — Z114 Encounter for screening for human immunodeficiency virus [HIV]: Secondary | ICD-10-CM

## 2020-03-11 DIAGNOSIS — E785 Hyperlipidemia, unspecified: Secondary | ICD-10-CM

## 2020-03-11 DIAGNOSIS — E119 Type 2 diabetes mellitus without complications: Secondary | ICD-10-CM

## 2020-03-11 DIAGNOSIS — Z79899 Other long term (current) drug therapy: Secondary | ICD-10-CM

## 2020-03-11 DIAGNOSIS — Z125 Encounter for screening for malignant neoplasm of prostate: Secondary | ICD-10-CM

## 2020-03-22 ENCOUNTER — Other Ambulatory Visit: Payer: Self-pay

## 2020-03-22 ENCOUNTER — Other Ambulatory Visit (INDEPENDENT_AMBULATORY_CARE_PROVIDER_SITE_OTHER)

## 2020-03-22 DIAGNOSIS — Z125 Encounter for screening for malignant neoplasm of prostate: Secondary | ICD-10-CM

## 2020-03-22 DIAGNOSIS — E785 Hyperlipidemia, unspecified: Secondary | ICD-10-CM

## 2020-03-22 DIAGNOSIS — Z114 Encounter for screening for human immunodeficiency virus [HIV]: Secondary | ICD-10-CM | POA: Diagnosis not present

## 2020-03-22 DIAGNOSIS — Z79899 Other long term (current) drug therapy: Secondary | ICD-10-CM

## 2020-03-22 DIAGNOSIS — E119 Type 2 diabetes mellitus without complications: Secondary | ICD-10-CM

## 2020-03-22 LAB — BASIC METABOLIC PANEL
BUN: 24 mg/dL — ABNORMAL HIGH (ref 6–23)
CO2: 27 mEq/L (ref 19–32)
Calcium: 9.9 mg/dL (ref 8.4–10.5)
Chloride: 103 mEq/L (ref 96–112)
Creatinine, Ser: 1.03 mg/dL (ref 0.40–1.50)
GFR: 80.18 mL/min (ref >60.00)
Glucose, Bld: 116 mg/dL — ABNORMAL HIGH (ref 70–99)
Potassium: 4.5 mEq/L (ref 3.5–5.1)
Sodium: 139 mEq/L (ref 135–145)

## 2020-03-22 LAB — LIPID PANEL
Cholesterol: 118 mg/dL (ref 0–200)
HDL: 33.5 mg/dL — ABNORMAL LOW (ref >39.00)
LDL Cholesterol: 52 mg/dL (ref 0–99)
NonHDL: 84.41
Total CHOL/HDL Ratio: 4
Triglycerides: 164 mg/dL — ABNORMAL HIGH (ref 0.0–149.0)
VLDL: 32.8 mg/dL (ref 0.0–40.0)

## 2020-03-22 LAB — HEPATIC FUNCTION PANEL
ALT: 51 U/L (ref 0–53)
AST: 31 U/L (ref 0–37)
Albumin: 4.2 g/dL (ref 3.5–5.2)
Alkaline Phosphatase: 68 U/L (ref 39–117)
Bilirubin, Direct: 0.1 mg/dL (ref 0.0–0.3)
Total Bilirubin: 0.4 mg/dL (ref 0.2–1.2)
Total Protein: 7.1 g/dL (ref 6.0–8.3)

## 2020-03-22 LAB — CBC WITH DIFFERENTIAL/PLATELET
Basophils Absolute: 0 10*3/uL (ref 0.0–0.1)
Basophils Relative: 0.7 % (ref 0.0–3.0)
Eosinophils Absolute: 0.4 10*3/uL (ref 0.0–0.7)
Eosinophils Relative: 5.7 % — ABNORMAL HIGH (ref 0.0–5.0)
HCT: 44.8 % (ref 39.0–52.0)
Hemoglobin: 15.1 g/dL (ref 13.0–17.0)
Lymphocytes Relative: 29.5 % (ref 12.0–46.0)
Lymphs Abs: 2 10*3/uL (ref 0.7–4.0)
MCHC: 33.7 g/dL (ref 30.0–36.0)
MCV: 87.8 fl (ref 78.0–100.0)
Monocytes Absolute: 0.7 10*3/uL (ref 0.1–1.0)
Monocytes Relative: 10 % (ref 3.0–12.0)
Neutro Abs: 3.6 10*3/uL (ref 1.4–7.7)
Neutrophils Relative %: 54.1 % (ref 43.0–77.0)
Platelets: 225 10*3/uL (ref 150.0–400.0)
RBC: 5.1 Mil/uL (ref 4.22–5.81)
RDW: 14.5 % (ref 11.5–15.5)
WBC: 6.6 10*3/uL (ref 4.0–10.5)

## 2020-03-22 LAB — HEMOGLOBIN A1C: Hgb A1c MFr Bld: 7.4 % — ABNORMAL HIGH (ref 4.6–6.5)

## 2020-03-22 NOTE — Addendum Note (Signed)
Addended by: Alvina Chou on: 03/22/2020 01:08 PM   Modules accepted: Orders

## 2020-03-25 LAB — HIV ANTIBODY (ROUTINE TESTING W REFLEX): HIV 1&2 Ab, 4th Generation: NONREACTIVE

## 2020-03-25 LAB — PSA, TOTAL WITH REFLEX TO PSA, FREE: PSA, Total: 0.7 ng/mL (ref <=4.0)

## 2020-03-28 ENCOUNTER — Ambulatory Visit (INDEPENDENT_AMBULATORY_CARE_PROVIDER_SITE_OTHER): Admitting: Family Medicine

## 2020-03-28 ENCOUNTER — Other Ambulatory Visit: Payer: Self-pay

## 2020-03-28 ENCOUNTER — Encounter: Payer: Self-pay | Admitting: Family Medicine

## 2020-03-28 ENCOUNTER — Telehealth: Payer: Self-pay | Admitting: *Deleted

## 2020-03-28 VITALS — BP 120/60 | HR 82 | Temp 98.5°F | Ht 65.0 in | Wt 296.5 lb

## 2020-03-28 DIAGNOSIS — Z23 Encounter for immunization: Secondary | ICD-10-CM | POA: Diagnosis not present

## 2020-03-28 DIAGNOSIS — Z0000 Encounter for general adult medical examination without abnormal findings: Secondary | ICD-10-CM | POA: Diagnosis not present

## 2020-03-28 MED ORDER — TADALAFIL 20 MG PO TABS
10.0000 mg | ORAL_TABLET | Freq: Every day | ORAL | 11 refills | Status: DC | PRN
Start: 2020-03-28 — End: 2022-05-13

## 2020-03-28 NOTE — Telephone Encounter (Signed)
Received fax from [facility] requesting PA for Tadalafil 20 mg.  PA completed on CoverMyMeds and sent for review.  Can take up to 72 hours for a decision.

## 2020-03-28 NOTE — Progress Notes (Signed)
Zyria Fiscus T. Alyss Granato, MD, CAQ Sports Medicine  Primary Care and Sports Medicine [facility] at [facility] [address] [place], [address]  Phone: [phone]  FAX: [phone]  Kevin [name] - 59 y.o. male  MRN [id number]  Date of Birth: 01-02-1961  Date: 03/28/2020  PCP: Hannah Beat, MD  Referral: Hannah Beat, MD  Chief Complaint  Patient presents with  . Annual Exam    This visit occurred during the SARS-CoV-2 public health emergency.  Safety protocols were in place, including screening questions prior to the visit, additional usage of staff PPE, and extensive cleaning of exam room while observing appropriate contact time as indicated for disinfecting solutions.   Patient Care Team: Hannah Beat, MD as PCP - General Subjective:   DREVION OFFORD is a 59 y.o. pleasant patient who presents with the following:  Preventative Health Maintenance Visit:  Health Maintenance Summary Reviewed and updated, unless pt declines services.  Tobacco History Reviewed. Alcohol: No concerns, no excessive use Exercise Habits: Some activity, rec at least 30 mins 5 times a week - minumal right now STD concerns: no risk or activity to increase risk Drug Use: None  Smoking? None  Wt Readings from Last 3 Encounters:  03/28/20 296 lb 8 oz (134.5 kg)  11/06/19 294 lb (133.4 kg)  10/29/19 285 lb (129.3 kg)     Diabetes Mellitus: Tolerating Medications: yes Compliance with diet: fair, Body mass index is 49.34 kg/m. - eating a lot of peanut butter at night and OJ in the morning Exercise: minimal / intermittent Avg blood sugars at home: not checking Foot problems: none Hypoglycemia: none No nausea, vomitting, blurred vision, polyuria.  Lab Results  Component Value Date   HGBA1C 7.4 (H) 03/22/2020   HGBA1C 6.0 (A) 06/14/2019   HGBA1C 7.6 (H) 03/10/2019   Lab Results  Component Value Date   LDLCALC 52 03/22/2020   CREATININE 1.03  03/22/2020    Wt Readings from Last 3 Encounters:  03/28/20 296 lb 8 oz (134.5 kg)  11/06/19 294 lb (133.4 kg)  10/29/19 285 lb (129.3 kg)     Health Maintenance  Topic Date Due  . FOOT EXAM  Never done  . COVID-19 Vaccine (1) Never done  . OPHTHALMOLOGY EXAM  09/19/2020  . HEMOGLOBIN A1C  09/20/2020  . COLONOSCOPY  12/23/2023  . TETANUS/TDAP  02/02/2027  . INFLUENZA VACCINE  Completed  . PNEUMOCOCCAL POLYSACCHARIDE VACCINE AGE 21-64 HIGH RISK  Completed  . Hepatitis C Screening  Completed  . HIV Screening  Completed   Immunization History  Administered Date(s) Administered  . Influenza,inj,Quad PF,6+ Mos 01/16/2015, 02/01/2017, 02/09/2018, 03/15/2019, 03/28/2020  . Pneumococcal Polysaccharide-23 03/15/2019  . Td 10/23/2005  . Tdap 02/01/2017   Patient Active Problem List   Diagnosis Date Noted  . Diabetes mellitus without complication (HCC) 04/12/2019  . OSA on CPAP 04/27/2016  . Generalized anxiety disorder 10/01/2014  . 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 Morbid obesity with BMI of 50.0-59.9, adult (HCC) 08/31/2013  . SLEEP APNEA 06/16/2010  . HYPERCHOLESTEROLEMIA 12/31/2008  . 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 Essential hypertension 12/31/2008    Past Medical History:  Diagnosis Date  . Hyperlipidemia   . Hypertension   . Obesity     History reviewed. No pertinent surgical history.  Family History  Problem Relation Age of Onset  . Heart attack Mother   . Coronary artery disease Mother   . COPD Father   . Diabetes Father  Past Medical History, Surgical History, Social History, Family History, Problem List, Medications, and Allergies have been reviewed and updated if relevant.  Review of Systems: Pertinent positives are listed above.  Otherwise, a full 14 point review of systems has been done in full and it is negative except where it is noted positive.  Objective:   BP 120/60   Pulse 82   Temp 98.5 F (36.9 C) (Temporal)   Ht 5'  5" (1.651 m)   Wt 296 lb 8 oz (134.5 kg)   SpO2 96%   BMI 49.34 kg/m  Ideal Body Weight: Weight in (lb) to have BMI = 25: 149.9  Ideal Body Weight: Weight in (lb) to have BMI = 25: 149.9 No exam data present Depression screen [facility] 2/9 03/28/2020 04/04/2019 03/15/2019 02/09/2018 02/01/2017  Decreased Interest 0 0 0 0 0  Down, Depressed, Hopeless 0 0 0 0 0  PHQ - 2 Score 0 0 0 0 0     GEN: well developed, well nourished, no acute distress Eyes: conjunctiva and lids normal, PERRLA, EOMI ENT: TM clear, nares clear, oral exam WNL Neck: supple, no lymphadenopathy, no thyromegaly, no JVD Pulm: clear to auscultation and percussion, respiratory effort normal CV: regular rate and rhythm, S1-S2, no murmur, rub or gallop, no bruits, peripheral pulses normal and symmetric, no cyanosis, clubbing, edema or varicosities GI: soft, non-tender; no hepatosplenomegaly, masses; active bowel sounds all quadrants GU: deferred Lymph: no cervical, axillary or inguinal adenopathy MSK: gait normal, muscle tone and strength WNL, no joint swelling, effusions, discoloration, crepitus  SKIN: clear, good turgor, color WNL, no rashes, lesions, or ulcerations Neuro: normal mental status, normal strength, sensation, and motion Psych: alert; oriented to person, place and time, normally interactive and not anxious or depressed in appearance.  All labs reviewed with patient. Results for orders placed or performed in visit on 03/22/20  HIV Antibody (routine testing w rflx)  Result Value Ref Range   HIV 1&2 Ab, 4th Generation NON-REACTIVE NON-REACTI  PSA, Total with Reflex to PSA, Free  Result Value Ref Range   PSA, Total 0.7 < OR = 4.0 ng/mL  Basic metabolic panel  Result Value Ref Range   Sodium 139 135 - 145 mEq/L   Potassium 4.5 3.5 - 5.1 mEq/L   Chloride 103 96 - 112 mEq/L   CO2 27 19 - 32 mEq/L   Glucose, Bld 116 (H) 70 - 99 mg/dL   BUN 24 (H) 6 - 23 mg/dL   Creatinine, Ser 8.36 0.40 - 1.50 mg/dL   GFR 62.94  >76.54 mL/min   Calcium 9.9 8.4 - 10.5 mg/dL  Hepatic function panel  Result Value Ref Range   Total Bilirubin 0.4 0.2 - 1.2 mg/dL   Bilirubin, Direct 0.1 0.0 - 0.3 mg/dL   Alkaline Phosphatase 68 39 - 117 U/L   AST 31 0 - 37 U/L   ALT 51 0 - 53 U/L   Total Protein 7.1 6.0 - 8.3 g/dL   Albumin 4.2 3.5 - 5.2 g/dL  CBC with Differential/Platelet  Result Value Ref Range   WBC 6.6 4.0 - 10.5 K/uL   RBC 5.10 4.22 - 5.81 Mil/uL   Hemoglobin 15.1 13.0 - 17.0 g/dL   HCT 65.0 39 - 52 %   MCV 87.8 78.0 - 100.0 fl   MCHC 33.7 30.0 - 36.0 g/dL   RDW 35.4 65.6 - 81.2 %   Platelets 225.0 150 - 400 K/uL   Neutrophils Relative % 54.1 43 - 77 %  Lymphocytes Relative 29.5 12 - 46 %   Monocytes Relative 10.0 3 - 12 %   Eosinophils Relative 5.7 (H) 0 - 5 %   Basophils Relative 0.7 0 - 3 %   Neutro Abs 3.6 1.4 - 7.7 K/uL   Lymphs Abs 2.0 0.7 - 4.0 K/uL   Monocytes Absolute 0.7 0.1 - 1.0 K/uL   Eosinophils Absolute 0.4 0.0 - 0.7 K/uL   Basophils Absolute 0.0 0.0 - 0.1 K/uL  Hemoglobin A1c  Result Value Ref Range   Hgb A1c MFr Bld 7.4 (H) 4.6 - 6.5 %  Lipid panel  Result Value Ref Range   Cholesterol 118 0 - 200 mg/dL   Triglycerides 161.0164.0 (H) 0 - 149 mg/dL   HDL 96.0433.50 (L) >54.09>39.00 mg/dL   VLDL 81.132.8 0.0 - 91.440.0 mg/dL   LDL Cholesterol 52 0 - 99 mg/dL   Total CHOL/HDL Ratio 4    NonHDL 84.41     Assessment and Plan:     ICD-10-CM   1. Healthcare maintenance  Z00.00   2. Need for influenza vaccination  Z23 Flu Vaccine QUAD 6+ mos PF IM (Fluarix Quad PF)   BS up to 7.4 a1c, work on weight loss and diet and recheck in 3 mo We discussed basic DM diet  Otherwise, he is generally doing ok  Health Maintenance Exam: The patient's preventative maintenance and recommended screening tests for an annual wellness exam were reviewed in full today. Brought up to date unless services declined.  Counselled on the importance of diet, exercise, and its role in overall health and mortality. The  patient's FH and SH was reviewed, including their home life, tobacco status, and drug and alcohol status.  Follow-up in 1 year for physical exam or additional follow-up below.  Follow-up: Return in about 3 months (around 06/28/2020). Or follow-up in 1 year if not noted.  Meds ordered this encounter  Medications  . tadalafil (CIALIS) 20 MG tablet    Sig: Take 0.5-1 tablets (10-20 mg total) by mouth daily as needed for erectile dysfunction.    Dispense:  5 tablet    Refill:  11   Medications Discontinued During This Encounter  Medication Reason  . mometasone (ELOCON) 0.1 % ointment Completed Course   Orders Placed This Encounter  Procedures  . Flu Vaccine QUAD 6+ mos PF IM (Fluarix Quad PF)    Signed,  Kyon Bentler T. Renaye Janicki, MD   Allergies as of 03/28/2020   No Known Allergies     Medication List       Accurate as of March 28, 2020  8:59 AM. If you have any questions, ask your nurse or doctor.        STOP taking these medications   mometasone 0.1 % ointment Commonly known as: ELOCON Stopped by: Hannah BeatSpencer Lawson Isabell, MD     TAKE these medications   acetaminophen 500 MG tablet Commonly known as: TYLENOL Take 500 mg by mouth every 6 (six) hours as needed.   amitriptyline 25 MG tablet Commonly known as: ELAVIL TAKE 1 TABLET(25 MG) BY MOUTH AT BEDTIME AS NEEDED FOR SLEEP   aspirin 81 MG tablet Take 81 mg by mouth daily.   diclofenac 50 MG EC tablet Commonly known as: VOLTAREN Take 50 mg by mouth 2 (two) times daily.   escitalopram 10 MG tablet Commonly known as: LEXAPRO TAKE 2 TABLETS AT BEDTIME   lisinopril 20 MG tablet Commonly known as: ZESTRIL TAKE 1 TABLET DAILY   metFORMIN 500 MG 24 hr  tablet Commonly known as: GLUCOPHAGE-XR TAKE 1 TABLET DAILY WITH BREAKFAST   multivitamin with minerals tablet Take 1 tablet by mouth daily.   rosuvastatin 40 MG tablet Commonly known as: CRESTOR TAKE 1 TABLET DAILY   tadalafil 20 MG tablet Commonly known as:  CIALIS Take 0.5-1 tablets (10-20 mg total) by mouth daily as needed for erectile dysfunction. Started by: Hannah Beat, MD   tamsulosin 0.4 MG Caps capsule Commonly known as: FLOMAX TAKE 1 CAPSULE DAILY   vitamin C 1000 MG tablet Take 1,000 mg by mouth daily.

## 2020-03-29 NOTE — Telephone Encounter (Signed)
PA denied.  Patient must try generic sildenafil first.

## 2020-03-30 MED ORDER — SILDENAFIL CITRATE 100 MG PO TABS
50.0000 mg | ORAL_TABLET | Freq: Every day | ORAL | 11 refills | Status: DC | PRN
Start: 2020-03-30 — End: 2022-05-13

## 2020-03-30 NOTE — Telephone Encounter (Signed)
Can you let him know that I changed rx to viagra

## 2020-03-30 NOTE — Addendum Note (Signed)
Addended by: Hannah Beat on: 03/30/2020 08:35 AM   Modules accepted: Orders

## 2020-04-01 NOTE — Telephone Encounter (Signed)
Kevin [name] notified as instructed by telephone..  Patient states understanding.

## 2020-04-11 ENCOUNTER — Other Ambulatory Visit: Payer: Self-pay | Admitting: Family Medicine

## 2020-04-23 ENCOUNTER — Other Ambulatory Visit: Payer: Self-pay | Admitting: Family Medicine

## 2020-05-14 ENCOUNTER — Other Ambulatory Visit: Payer: Self-pay

## 2020-05-14 ENCOUNTER — Other Ambulatory Visit: Payer: Self-pay | Admitting: Neurology

## 2020-05-14 MED ORDER — ESCITALOPRAM OXALATE 10 MG PO TABS
20.0000 mg | ORAL_TABLET | Freq: Every day | ORAL | 0 refills | Status: DC
Start: 2020-05-14 — End: 2020-11-19

## 2020-05-14 NOTE — Telephone Encounter (Signed)
Ok to fill short term or send to Dr. Vickey Huger?

## 2020-05-14 NOTE — Telephone Encounter (Signed)
[name] left v/m that pt needs refill for lexapro 10 mg to [facility] [address] for [address]. Dr Dohmeier filled lexapro 10 mg # 180 x 1 on [facility]. I tried calling [name] at both contact #s and could not speak with anyone. Should Dr Vickey Huger fill the short term lexapro rx? Sending to [facility].

## 2020-05-20 ENCOUNTER — Other Ambulatory Visit: Payer: Self-pay | Admitting: Family Medicine

## 2020-07-30 ENCOUNTER — Other Ambulatory Visit: Payer: Self-pay | Admitting: Family Medicine

## 2020-09-18 ENCOUNTER — Other Ambulatory Visit: Payer: Self-pay | Admitting: Urology

## 2020-10-28 ENCOUNTER — Other Ambulatory Visit: Payer: Self-pay | Admitting: Neurology

## 2020-11-03 ENCOUNTER — Telehealth: Payer: Self-pay | Admitting: Family Medicine

## 2020-11-03 NOTE — Telephone Encounter (Signed)
Arline Asp,   Can you help set him up for an office visit to recheck his diabetes wed or Thursday.  I want him to write down his blood sugar three times a day until then and keep taking the 2 Metformin tablets.  Thanks!

## 2020-11-03 NOTE — Telephone Encounter (Signed)
-----   Message from Donato Schultz, DO sent at 11/02/2020 12:13 AM EDT ----- Service called--- pt bs >400--- pt instructed to inc metformin to 2 a day and f/u pcp Monday Bs 411 during phone call--- go to er if pt becomes symptomatic

## 2020-11-04 NOTE — Telephone Encounter (Signed)
[facility] Night - Client TELEPHONE ADVICE RECORD [employer] Patient Name: Kevin [name] Gender: Male DOB: January 13, 1961 Age: 60 Y 11 M 3 D Return Phone Number: [phone] (Primary), [phone] (Secondary) Address: City/ State/ Zip: [place]  [address] Client [facility] Night - Client Client Site [facility] [place] - Night Physician Hannah Beat - MD Contact Type Call Who Is Calling Patient / Member / Family / Caregiver Call Type Triage / Clinical Caller Name Kannon Granderson Relationship To Patient Spouse Return Phone Number [phone] (Primary) Chief Complaint Blood Sugar High Reason for Call Symptomatic / Request for Health Information Initial Comment Caller's husband blood sugar was 372 at 10 AM. The patient has dry mouth and is thirsty but otherwise feels well. Translation No Nurse Assessment Nurse: Valentina Lucks, RN, [employer] Hoist Date/Time Lamount Cohen Time): 11/02/2020 11:12:47 AM Confirm and document reason for call. If symptomatic, describe symptoms. ---Patient's fasting glucose was 372. Patient has dry mouth, thirst and frequency. Caller states no other sx. Does the patient have any new or worsening symptoms? ---Yes Will a triage be completed? ---Yes Related visit to physician within the last 2 weeks? ---No Does the PT have any chronic conditions? (i.e. diabetes, asthma, this includes High risk factors for pregnancy, etc.) ---Yes List chronic conditions. ---DM, HTN Is this a behavioral health or substance abuse call? ---No Guidelines Guideline Title Affirmed Question Affirmed Notes Nurse Date/Time (Eastern Time) Diabetes - High Blood Sugar Blood glucose > 400 mg/dL (02.5 mmol/L) Valentina Lucks, RN, Tempie Hoist 11/02/2020 11:14:01 AM Disp. Time Lamount Cohen Time) Disposition Final User 11/02/2020 11:19:42 AM Call PCP Now Yes Valentina Lucks, RN, Tempie Hoist PLEASE NOTE: All timestamps  contained within this report are represented as [place] Standard Time. CONFIDENTIALTY NOTICE: This fax transmission is intended only for the addressee. It contains information that is legally privileged, confidential or otherwise protected from use or disclosure. If you are not the intended recipient, you are strictly prohibited from reviewing, disclosing, copying using or disseminating any of this information or taking any action in reliance on or regarding this information. If you have received this fax in error, please notify us immediately by telephone so that we can arrange for its return to [place]. Phone: [phone], Toll-Free: [phone], Fax: [phone] Page: 2 of 2 Call Id: [id number] Caller Disagree/Comply Comply Caller Understands Yes PreDisposition Call Doctor Care Advice Given Per Guideline CALL PCP NOW: * You need to discuss this with your doctor (or NP/PA). * I'll page the on-call provider now. If you haven't heard from the provider (or me) within 30 minutes, call again. CALL BACK IF: * Vomiting occurs * Rapid breathing occurs * You become worse CARE ADVICE given per Diabetes - High Blood Sugar (Adult) guideline. Comments User: Tempie Hoist, Valentina Lucks, RN Date/Time Lamount Cohen Time): 11/02/2020 11:17:39 AM Glucose is currently 411. User: Tempie Hoist, Valentina Lucks, RN Date/Time Lamount Cohen Time): 11/02/2020 11:19:34 AM Patient takes Glucophage 500 mg QD. Patient had had it this morning at1000. User: Tempie Hoist, Valentina Lucks, RN Date/Time Lamount Cohen Time): 11/02/2020 11:24:41 AM RN advised caller to take patient to UC/ED dt no on call provider listed at this time. No further orders.

## 2020-11-04 NOTE — Telephone Encounter (Signed)
Pt has apt on 6/15 with Dr.Copland

## 2020-11-04 NOTE — Telephone Encounter (Signed)
[facility] Night - Client TELEPHONE ADVICE RECORD AccessNurse Patient Name: Kevin [name] Gender: Male DOB: 1960-12-15 Age: 60 Y 11 M 3 D Return Phone Number: [phone] (Primary), [phone] (Secondary) Address: City/ State/ Zip: [place]  [address] Client [facility] Night - Client Client Site [facility] - Night Physician Hannah Beat - MD Contact Type Call Who Is Calling Patient / Member / Family / Caregiver Call Type Triage / Clinical Caller Name Kevin [name] Relationship To Patient Spouse Return Phone Number [phone] (Primary) Chief Complaint Blood Sugar High Reason for Call Symptomatic / Request for Health Information Initial Comment Caller states her husband has a sugar of 459 and 487. He is thirsty and urinating more. Translation No Nurse Assessment Nurse: Thomes Lolling, RN, Bailer Date/Time (Eastern Time): 11/01/2020 11:48:00 PM Confirm and document reason for call. If symptomatic, describe symptoms. ---Caller states pt has had high blood sugar tonight of 459 and then 487 an hour later. Is having excessive thirst/dry mouth. Does the patient have any new or worsening symptoms? ---Yes Will a triage be completed? ---Yes Related visit to physician within the last 2 weeks? ---No Does the PT have any chronic conditions? (i.e. diabetes, asthma, this includes High risk factors for pregnancy, etc.) ---Yes List chronic conditions. ---on Glucophage Is this a behavioral health or substance abuse call? ---No Guidelines Guideline Title Affirmed Question Affirmed Notes Nurse Date/Time (Eastern Time) Diabetes - High Blood Sugar Blood glucose > 400 mg/dL (20.1 mmol/L) Thomes Lolling, RN, Bailer 11/01/2020 11:50:16 PM Disp. Time Lamount Cohen Time) Disposition Final User 11/01/2020 11:25:20 PM Attempt made - message left Thomes Lolling RNDaneen Schick 11/01/2020 11:59:54 PM Paged On Call back to [facility],  RN, Daneen Schick 11/02/2020 12:00:23 AM Paged On Call back to Call Center Luck, RN, Daneen Schick PLEASE NOTE: All timestamps contained within this report are represented as [place] Standard Time. CONFIDENTIALTY NOTICE: This fax transmission is intended only for the addressee. It contains information that is legally privileged, confidential or otherwise protected from use or disclosure. If you are not the intended recipient, you are strictly prohibited from reviewing, disclosing, copying using or disseminating any of this information or taking any action in reliance on or regarding this information. If you have received this fax in error, please notify us immediately by telephone so that we can arrange for its return to [place]. Phone: [phone], Toll-Free: [phone], Fax: [phone] Page: 2 of 3 Call Id: [id number] 11/02/2020 12:00:02 AM Call PCP Now Yes Thomes Lolling, RN, Huston Foley Disagree/Comply Comply Caller Understands Yes PreDisposition Home Care Care Advice Given Per Guideline RECHECK: * If you have not done so already, recheck your blood sugar to make certain that it is really that high. CALL BACK IF: * Vomiting occurs * Rapid breathing occurs * You become worse CARE ADVICE given per Diabetes - High Blood Sugar (Adult) guideline. CALL PCP NOW: * I'll page the on-call provider now. If you haven't heard from the provider (or me) within 30 minutes, call again. Comments User: Lenise Arena, RN Date/Time Lamount Cohen Time): 11/01/2020 11:52:28 PM Blood glucose was 487 at 10:45 User: Lenise Arena, RN Date/Time Lamount Cohen Time): 11/01/2020 11:54:13 PM Also having frequent urination. User: Lenise Arena, RN Date/Time Lamount Cohen Time): 11/01/2020 11:58:59 PM Blood glucose is now 411. User: Lenise Arena, RN Date/Time Lamount Cohen Time): 11/02/2020 12:12:00 AM Glucophage dose is 500 mg 1x Referrals REFERRED TO PCP OFFICE Paging DoctorName Phone DateTime Result/ Outcome Message Type Notes Seabron Spates- MD  [phone] 11/01/2020 11:59:54 PM  Paged On Call Back to Call Center Doctor Paged Pt's blood glucose now is (459, 487 prev) and is having increased thirst/frequent urination. Only on glucophage no insulin. Please call me back with instructions for pt. [phone] Thanks Seabron Spates- MD 11/02/2020 12:16:06 AM Spoke with On Call - General Message Result Spoke with Dr. Zola Button and she instructed me to tell pt to increase to take another dose of the glucophage tonight and two doses from here until told otherwise after appt with Dr. Patsy Lager. She wants pt to call office to make an PLEASE NOTE: All timestamps contained within this report are represented as [place] Standard Time. CONFIDENTIALTY NOTICE: This fax transmission is intended only for the addressee. It contains information that is legally privileged, confidential or otherwise protected from use or disclosure. If you are not the intended recipient, you are strictly prohibited from reviewing, disclosing, copying using or disseminating any of this information or taking any action in reliance on or regarding this information. If you have received this fax in error, please notify us immediately by telephone so that we can arrange for its return to [place]. Phone: [phone], Toll-Free: [phone], Fax: [phone] Page: 3 of 3 Call Id: [id number] Paging DoctorName Phone DateTime Result/ Outcome Message Type Notes appt with Dr. Patsy Lager for Monday. If any worsening of symptoms or increase in sugar, she instructed him to go to ED. Relayed information to pt and he verbalized understanding.

## 2020-11-05 ENCOUNTER — Ambulatory Visit: Admitting: Adult Health

## 2020-11-05 NOTE — Progress Notes (Signed)
Sharel Behne T. Minal Stuller, MD, CAQ Sports Medicine [facility] [address] [place], [address]  Phone: [phone]  FAX: [phone]  Kevin [name] - 60 y.o. male  MRN [id number]  Date of Birth: [birth year]  Date: 11/06/2020  PCP: Hannah Beat, MD  Referral: Hannah Beat, MD  Chief Complaint  Patient presents with   Hyperglycemia    Seen at Urgent Care in [facility] on Saturday    This visit occurred during the SARS-CoV-2 public health emergency.  Safety protocols were in place, including screening questions prior to the visit, additional usage of staff PPE, and extensive cleaning of exam room while observing appropriate contact time as indicated for disinfecting solutions.   Subjective:   Kevin [name] is a 60 y.o. very pleasant male patient with Body mass index is 48.09 kg/m. who presents with the following:  Acute uncontrolled DM with some BS > 400.  Increased Metformin over the weekend, and he is here in follow-up.  Lab Results  Component Value Date   HGBA1C 10.7 (A) 11/06/2020    Up from 7.4, 7 months ago  Taking Metformin 500 mg x 3 daily  He is here with his wife, and they have multiple blood sugars written down anywhere from 300s to high 400s.  He did had increased urination quite a bit, and they checked his blood sugar at home and it was greater than 400.  Review of Systems is noted in the HPI, as appropriate  Objective:   BP 90/62   Pulse 93   Temp 98.4 F (36.9 C) (Temporal)   Ht 5\' 5"  (1.651 m)   Wt 289 lb (131.1 kg)   SpO2 95%   BMI 48.09 kg/m   GEN: No acute distress; alert,appropriate. PULM: Breathing comfortably in no respiratory distress PSYCH: Normally interactive.   Laboratory and Imaging Data: Results for orders placed or performed in visit on 11/06/20  POCT glycosylated hemoglobin (Hb A1C)  Result Value Ref Range   Hemoglobin A1C 10.7 (A) 4.0 - 5.6 %   HbA1c POC (<> result,  manual entry)     HbA1c, POC (prediabetic range)     HbA1c, POC (controlled diabetic range)       Assessment and Plan:     ICD-10-CM   1. Diabetes mellitus without complication (HCC)  E11.9 POCT glycosylated hemoglobin (Hb A1C)     Acute exacerbation and destabilization of chronic diabetes.  His diet has remained the same, and he still has low activity level, but his blood sugars have dramatically increased.  He is going to start walking with his wife, continue to try to improve his diet.   Patient Instructions  Today:  Start taking 4 Metformin tablets daily.  Take 2 tablets twice a day. You will keep doing this forever.  In 1 week, Start Glipizide ER 5 mg daily. After 2 weeks, start taking 2 tablets a day. -- I did send in a refill for 10 mg tablets after the one bottle is empty.   Meds ordered this encounter  Medications   metFORMIN (GLUCOPHAGE-XR) 500 MG 24 hr tablet    Sig: Take 2 tablets (1,000 mg total) by mouth 2 (two) times daily.    Dispense:  360 tablet    Refill:  1   glipiZIDE (GLUCOTROL XL) 5 MG 24 hr tablet    Sig: Take 1 taken once a day for 1 week, then increase to 2 tablets once a day    Dispense:  60 tablet    Refill:  0   glipiZIDE (GLUCOTROL XL) 10 MG 24 hr tablet    Sig: Take 1 tablet (10 mg total) by mouth daily with breakfast.    Dispense:  90 tablet    Refill:  1    This is for future refills   Medications Discontinued During This Encounter  Medication Reason   metFORMIN (GLUCOPHAGE-XR) 500 MG 24 hr tablet    Orders Placed This Encounter  Procedures   POCT glycosylated hemoglobin (Hb A1C)    Follow-up: Return in about 3 months (around 02/06/2021) for diabetes recheck.  Signed,  Elpidio Galea. Gareth Fitzner, MD   Outpatient Encounter Medications as of 11/06/2020  Medication Sig   acetaminophen (TYLENOL) 500 MG tablet Take 500 mg by mouth every 6 (six) hours as needed.   amitriptyline (ELAVIL) 25 MG tablet TAKE 1 TABLET(25 MG) BY MOUTH AT BEDTIME  AS NEEDED FOR SLEEP   Ascorbic Acid (VITAMIN C) 1000 MG tablet Take 1,000 mg by mouth daily.   aspirin 81 MG tablet Take 81 mg by mouth daily.     diclofenac (VOLTAREN) 50 MG EC tablet Take 50 mg by mouth 2 (two) times daily.   escitalopram (LEXAPRO) 10 MG tablet Take 2 tablets (20 mg total) by mouth at bedtime.   glipiZIDE (GLUCOTROL XL) 10 MG 24 hr tablet Take 1 tablet (10 mg total) by mouth daily with breakfast.   glipiZIDE (GLUCOTROL XL) 5 MG 24 hr tablet Take 1 taken once a day for 1 week, then increase to 2 tablets once a day   lisinopril (ZESTRIL) 20 MG tablet TAKE 1 TABLET DAILY   Multiple Vitamins-Minerals (MULTIVITAMIN WITH MINERALS) tablet Take 1 tablet by mouth daily.   rosuvastatin (CRESTOR) 40 MG tablet TAKE 1 TABLET DAILY   sildenafil (VIAGRA) 100 MG tablet Take 0.5-1 tablets (50-100 mg total) by mouth daily as needed for erectile dysfunction.   tadalafil (CIALIS) 20 MG tablet Take 0.5-1 tablets (10-20 mg total) by mouth daily as needed for erectile dysfunction.   tamsulosin (FLOMAX) 0.4 MG CAPS capsule TAKE 1 CAPSULE DAILY   [DISCONTINUED] metFORMIN (GLUCOPHAGE-XR) 500 MG 24 hr tablet TAKE 1 TABLET DAILY WITH BREAKFAST (Patient taking differently: Take 1,000 mg by mouth daily with breakfast. And 500 mg every evening)   metFORMIN (GLUCOPHAGE-XR) 500 MG 24 hr tablet Take 2 tablets (1,000 mg total) by mouth 2 (two) times daily.   No facility-administered encounter medications on file as of 11/06/2020.

## 2020-11-06 ENCOUNTER — Ambulatory Visit: Admitting: Family Medicine

## 2020-11-06 ENCOUNTER — Other Ambulatory Visit: Payer: Self-pay

## 2020-11-06 ENCOUNTER — Other Ambulatory Visit: Payer: Self-pay | Admitting: Family Medicine

## 2020-11-06 ENCOUNTER — Encounter: Payer: Self-pay | Admitting: Family Medicine

## 2020-11-06 VITALS — BP 90/62 | HR 93 | Temp 98.4°F | Ht 65.0 in | Wt 289.0 lb

## 2020-11-06 DIAGNOSIS — E119 Type 2 diabetes mellitus without complications: Secondary | ICD-10-CM | POA: Diagnosis not present

## 2020-11-06 LAB — POCT GLYCOSYLATED HEMOGLOBIN (HGB A1C): Hemoglobin A1C: 10.7 % — AB (ref 4.0–5.6)

## 2020-11-06 MED ORDER — GLIPIZIDE ER 10 MG PO TB24: 10.0000 mg | ORAL_TABLET | Freq: Every day | ORAL | 1 refills | Status: DC

## 2020-11-06 MED ORDER — METFORMIN HCL ER 500 MG PO TB24
1000.0000 mg | ORAL_TABLET | Freq: Two times a day (BID) | ORAL | 1 refills | Status: DC
Start: 2020-11-06 — End: 2021-07-25

## 2020-11-06 MED ORDER — GLIPIZIDE ER 5 MG PO TB24: ORAL_TABLET | ORAL | 0 refills | Status: DC

## 2020-11-06 NOTE — Patient Instructions (Signed)
Today:  Start taking 4 Metformin tablets daily.  Take 2 tablets twice a day. You will keep doing this forever.  In 1 week, Start Glipizide ER 5 mg daily. After 2 weeks, start taking 2 tablets a day. -- I did send in a refill for 10 mg tablets after the one bottle is empty.

## 2020-11-07 ENCOUNTER — Ambulatory Visit: Admitting: Adult Health

## 2020-11-07 ENCOUNTER — Encounter: Payer: Self-pay | Admitting: Adult Health

## 2020-11-07 VITALS — BP 129/63 | HR 98 | Ht 65.0 in | Wt 290.0 lb

## 2020-11-07 DIAGNOSIS — G4733 Obstructive sleep apnea (adult) (pediatric): Secondary | ICD-10-CM

## 2020-11-07 DIAGNOSIS — Z9989 Dependence on other enabling machines and devices: Secondary | ICD-10-CM | POA: Diagnosis not present

## 2020-11-07 NOTE — Progress Notes (Signed)
PATIENT: Kevin [name] DOB: 10-Jan-1961  REASON FOR VISIT: follow up HISTORY FROM: patient  HISTORY OF PRESENT ILLNESS: Today 11/07/20: Kevin [name] is a 60 year old male with a history of obstructive sleep apnea on CPAP.  He reports that the CPAP is working well for him.  He denies any new issues.  He returns today for an evaluation.   11/06/19: Kevin [name] is a 60 year old male with a history of obstructive sleep apnea on CPAP.  His download indicates that he uses machine nightly for compliance of 100%.  He uses machine greater than 4 hours each night.  On average he uses his machine 8 hours and 30 minutes.  His residual AHI is 0.8 on 8 to 20 cm of water with EPR 3.  Leak in the 95th percentile is 18.6 L/min. He returns today for follow-up.   HISTORY 11/02/18:   Kevin [name] is a 60 year old male with a history of obstructive sleep apnea on CPAP and hypoxemia.  His download indicates that he uses machine nightly for compliance of 100%.  He uses machine greater than 4 hours each night.  On average he uses his machine 8 hours and 41 minutes.  His residual AHI is 1.8 on 8-20 cm of water with EPR 3.  His leak in the 95th percentile is 47.5 L/min.  He does report that he feels his mask leaking at night.  He does change out his supplies regularly.  He joins me today for virtual visit.  REVIEW OF SYSTEMS: Out of a complete 14 system review of symptoms, the patient complains only of the following symptoms, and all other reviewed systems are negative.   ESS 8  ALLERGIES: No Known Allergies  HOME MEDICATIONS: Outpatient Medications Prior to Visit  Medication Sig Dispense Refill   acetaminophen (TYLENOL) 500 MG tablet Take 500 mg by mouth every 6 (six) hours as needed.     amitriptyline (ELAVIL) 25 MG tablet TAKE 1 TABLET(25 MG) BY MOUTH AT BEDTIME AS NEEDED FOR SLEEP 90 tablet 1   Ascorbic Acid (VITAMIN C) 1000 MG tablet Take 1,000 mg by mouth daily.     aspirin 81 MG tablet Take  81 mg by mouth daily.       diclofenac (VOLTAREN) 50 MG EC tablet Take 50 mg by mouth 2 (two) times daily.     escitalopram (LEXAPRO) 10 MG tablet Take 2 tablets (20 mg total) by mouth at bedtime. 20 tablet 0   glipiZIDE (GLUCOTROL XL) 10 MG 24 hr tablet Take 1 tablet (10 mg total) by mouth daily with breakfast. 90 tablet 1   glipiZIDE (GLUCOTROL XL) 5 MG 24 hr tablet Take 1 taken once a day for 1 week, then increase to 2 tablets once a day 60 tablet 0   lisinopril (ZESTRIL) 20 MG tablet TAKE 1 TABLET DAILY 90 tablet 3   metFORMIN (GLUCOPHAGE-XR) 500 MG 24 hr tablet Take 2 tablets (1,000 mg total) by mouth 2 (two) times daily. 360 tablet 1   Multiple Vitamins-Minerals (MULTIVITAMIN WITH MINERALS) tablet Take 1 tablet by mouth daily.     rosuvastatin (CRESTOR) 40 MG tablet TAKE 1 TABLET DAILY 90 tablet 3   sildenafil (VIAGRA) 100 MG tablet Take 0.5-1 tablets (50-100 mg total) by mouth daily as needed for erectile dysfunction. 5 tablet 11   tadalafil (CIALIS) 20 MG tablet Take 0.5-1 tablets (10-20 mg total) by mouth daily as needed for erectile dysfunction. 5 tablet 11   tamsulosin (FLOMAX) 0.4 MG CAPS capsule TAKE  1 CAPSULE DAILY 90 capsule 3   No facility-administered medications prior to visit.    PAST MEDICAL HISTORY: Past Medical History:  Diagnosis Date   Hyperlipidemia    Hypertension    Obesity     PAST SURGICAL HISTORY: History reviewed. No pertinent surgical history.  FAMILY HISTORY: Family History  Problem Relation Age of Onset   Heart attack Mother    Coronary artery disease Mother    COPD Father    Diabetes Father     SOCIAL HISTORY: Social History   Socioeconomic History   Marital status: Married    Spouse name: Debbie   Number of children: Not on file   Years of education: Not on file   Highest education level: Not on file  Occupational History   Occupation: MILITARY    Employer: [employer]    Employer: [employer]    Employer: [employer]    Comment:  (bought out [employer])  Tobacco Use   Smoking status: Former    Packs/day: 1.00    Years: 15.00    Pack years: 15.00    Types: Cigarettes    Quit date: 06/27/2018    Years since quitting: 2.3   Smokeless tobacco: Never  Substance and Sexual Activity   Alcohol use: No    Alcohol/week: 0.0 standard drinks   Drug use: No   Sexual activity: Not on file  Other Topics Concern   Not on file  Social History Narrative   Former [employer]-2009, [employer]   Caffeine 4-5 cups daily avg.   Married no kids.    [employer], now [employer]   Social Determinants of Health   Financial Resource Strain: Not on file  Food Insecurity: Not on file  Transportation Needs: Not on file  Physical Activity: Not on file  Stress: Not on file  Social Connections: Not on file  Intimate Partner Violence: Not on file      PHYSICAL EXAM  Vitals:   11/07/20 1312  BP: 129/63  Pulse: 98  Weight: 290 lb (131.5 kg)  Height: 5\' 5"  (1.651 m)   Body mass index is 48.26 kg/m.  Generalized: Well developed, in no acute distress  Chest: Lungs clear to auscultation bilaterally  Neurological examination  Mentation: Alert oriented to time, place, history taking. Follows all commands speech and language fluent Cranial nerve II-XII: Extraocular movements were full, visual field were full on confrontational test Head turning and shoulder shrug  were normal and symmetric. Motor: The motor testing reveals 5 over 5 strength of all 4 extremities. Good symmetric motor tone is noted throughout.  Sensory: Sensory testing is intact to soft touch on all 4 extremities. No evidence of extinction is noted.  Gait and station: Gait is normal.    DIAGNOSTIC DATA (LABS, IMAGING, TESTING) - I reviewed patient records, labs, notes, testing and imaging myself where available.  Lab Results  Component Value Date   WBC 6.6 03/22/2020   HGB 15.1 03/22/2020   HCT 44.8 03/22/2020   MCV 87.8 03/22/2020   PLT 225.0 03/22/2020      Component Value  Date/Time   NA 139 03/22/2020 0748   K 4.5 03/22/2020 0748   CL 103 03/22/2020 0748   CO2 27 03/22/2020 0748   GLUCOSE 116 (H) 03/22/2020 0748   BUN 24 (H) 03/22/2020 0748   CREATININE 1.03 03/22/2020 0748   CALCIUM 9.9 03/22/2020 0748   PROT 7.1 03/22/2020 0748   ALBUMIN 4.2 03/22/2020 0748   AST 31 03/22/2020 0748  ALT 51 03/22/2020 0748   ALKPHOS 68 03/22/2020 0748   BILITOT 0.4 03/22/2020 0748   GFRNONAA 110.09 12/31/2008 0936   Lab Results  Component Value Date   CHOL 118 03/22/2020   HDL 33.50 (L) 03/22/2020   LDLCALC 52 03/22/2020   LDLDIRECT 55.0 03/10/2019   TRIG 164.0 (H) 03/22/2020   CHOLHDL 4 03/22/2020   Lab Results  Component Value Date   HGBA1C 10.7 (A) 11/06/2020      ASSESSMENT AND PLAN 60 y.o. year old male  has a past medical history of Hyperlipidemia, Hypertension, and Obesity. here with:  OSA on CPAP  - CPAP compliance excellent - Good treatment of AHI  - Encourage patient to use CPAP nightly and > 4 hours each night - F/U in 1 year or sooner if needed   I spent 20 minutes of face-to-face and non-face-to-face time with patient.  This included previsit chart review, lab review, study review, order entry, electronic health record documentation, patient education.  Butch Penny, MSN, NP-C 11/07/2020, 1:48 PM [facility] [address], [address] [place], [place] [address] [phone]

## 2020-11-07 NOTE — Patient Instructions (Signed)
Continue using CPAP nightly and greater than 4 hours each night °If your symptoms worsen or you develop new symptoms please let us know.  ° °

## 2020-11-19 ENCOUNTER — Telehealth: Payer: Self-pay | Admitting: Family Medicine

## 2020-11-19 MED ORDER — ESCITALOPRAM OXALATE 10 MG PO TABS
20.0000 mg | ORAL_TABLET | Freq: Every day | ORAL | 0 refills | Status: DC
Start: 2020-11-19 — End: 2021-03-07

## 2020-11-19 NOTE — Addendum Note (Signed)
Addended by: Damita Lack on: 11/19/2020 12:53 PM   Modules accepted: Orders

## 2020-11-19 NOTE — Telephone Encounter (Signed)
Refill sent as requested. 

## 2020-11-19 NOTE — Telephone Encounter (Signed)
Requesting refills of Lexapro Only has 3 pills left in bottle Last filled 05/14/20, #20, no refills  Pharmacy: [facility]

## 2021-03-05 ENCOUNTER — Telehealth: Payer: Self-pay | Admitting: Family Medicine

## 2021-03-05 NOTE — Telephone Encounter (Signed)
  Encourage patient to contact the pharmacy for refills or they can request refills through [facility]  LAST APPOINTMENT DATE:  Please schedule appointment if longer than 1 year  NEXT APPOINTMENT DATE:04/02/21  MEDICATION:escitalopram (LEXAPRO) 10 MG tablet  Is the patient out of medication? NO  PHARMACY:[id number] - [place], [place] - [address]  Let patient know to contact pharmacy at the end of the day to make sure medication is ready.  Please notify patient to allow 48-72 hours to process  CLINICAL FILLS OUT ALL BELOW:   LAST REFILL:  QTY:  REFILL DATE:    OTHER COMMENTS:    Okay for refill?  Please advise

## 2021-03-07 MED ORDER — ESCITALOPRAM OXALATE 10 MG PO TABS: 20.0000 mg | ORAL_TABLET | Freq: Every day | ORAL | 0 refills | Status: DC

## 2021-03-07 NOTE — Addendum Note (Signed)
Addended by: Eual Fines on: 03/07/2021 04:21 PM   Modules accepted: Orders

## 2021-03-26 ENCOUNTER — Other Ambulatory Visit

## 2021-03-31 NOTE — Progress Notes (Signed)
Cleston Lautner T. Amaira Safley, MD, [place] at [facility] [place], [address]  Phone: [phone]  FAX: [phone]  NIKOLAJ GERAGHTY - 60 y.o. male  MRN [id number]  Date of Birth: 1960-09-04  Date: 04/02/2021  PCP: Owens Loffler, MD  Referral: Owens Loffler, MD  Chief Complaint  Patient presents with   Annual Exam    This visit occurred during the SARS-CoV-2 public health emergency.  Safety protocols were in place, including screening questions prior to the visit, additional usage of staff PPE, and extensive cleaning of exam room while observing appropriate contact time as indicated for disinfecting solutions.   Patient Care Team: Owens Loffler, MD as PCP - General Subjective:   DEMARYIUS IMRAN is a 60 y.o. pleasant patient who presents with the following:  Preventative Health Maintenance Visit:  Health Maintenance Summary Reviewed and updated, unless pt declines services.  Tobacco History Reviewed.  None. Alcohol: No concerns, no excessive use - none Exercise Habits: Some activity, rec at least 30 mins 5 times a week STD concerns: no risk or activity to increase risk Drug Use: None  Diet better and walking some more.     Wt Readings from Last 3 Encounters:  04/02/21 275 lb 4 oz (124.9 kg)  11/07/20 290 lb (131.5 kg)  11/06/20 289 lb (131.1 kg)     Shingrix Bivalent covid - will get it.       Diabetes Mellitus: Tolerating Medications: yes Compliance with diet: fair, Body mass index is 46.52 kg/m. Exercise: minimal / intermittent Avg blood sugars at home: not checking Foot problems: none Hypoglycemia: none No nausea, vomitting, blurred vision, polyuria.  Lab Results  Component Value Date   HGBA1C 6.0 (A) 04/02/2021   HGBA1C 10.7 (A) 11/06/2020   HGBA1C 7.4 (H) 03/22/2020   Lab Results  Component Value Date   LDLCALC 52 03/22/2020   CREATININE 1.03 03/22/2020    Wt Readings from  Last 3 Encounters:  04/02/21 275 lb 4 oz (124.9 kg)  11/07/20 290 lb (131.5 kg)  11/06/20 289 lb (131.1 kg)     Health Maintenance  Topic Date Due   FOOT EXAM  Never done   Zoster Vaccines- Shingrix (1 of 2) Never done   Pneumococcal Vaccine 37-11 Years old (2 - PCV) 03/14/2020   COVID-19 Vaccine (4 - Booster for Pfizer series) 06/21/2020   OPHTHALMOLOGY EXAM  09/19/2020   INFLUENZA VACCINE  12/23/2020   HEMOGLOBIN A1C  09/30/2021   COLONOSCOPY (Pts 45-59yr Insurance coverage will need to be confirmed)  12/23/2023   TETANUS/TDAP  02/02/2027   Hepatitis C Screening  Completed   HIV Screening  Completed   HPV VACCINES  Aged Out   Immunization History  Administered Date(s) Administered   Anthrax 03/19/2006, 05/24/2006, 06/07/2006   Hepatitis A, Adult 03/25/1997, 09/22/1997   Hepatitis B, adult 08/31/1997, 10/02/1997, 04/14/2002   IPV 08/03/1984, 11/17/2002   Influenza Nasal 04/10/2005, 04/08/2007   Influenza Split 03/30/2000, 06/09/2001, 03/24/2002, 01/24/2004, 03/15/2006   Influenza,inj,Quad PF,6+ Mos 01/16/2015, 02/01/2017, 02/09/2018, 03/15/2019, 03/28/2020   MMR 08/13/1994   Meningococcal Polysaccharide 01/31/2003   PFIZER(Purple Top)SARS-COV-2 Vaccination 08/23/2019, 09/13/2019, 04/26/2020   Pneumococcal Polysaccharide-23 03/15/2019   Rabies Immune Globulin 05/24/2006, 06/25/2006   Rabies, intradermal 06/07/2006   Smallpox 03/19/2006   Td 03/25/1997, 10/23/2005, 03/04/2007   Tdap 02/01/2017   Typhoid Inactivated 04/14/2005, 03/04/2007   Typhoid Parenteral 11/17/2002   Varicella 04/14/2005, 07/20/2005   Yellow Fever 08/23/1997, 05/25/2007   Patient  Active Problem List   Diagnosis Date Noted   Diabetes mellitus without complication ([place]) 93/79/0240   OSA on CPAP 04/27/2016   Generalized anxiety disorder 10/01/2014   Hypersomnia with sleep apnea 07/16/2014   Morbid obesity with BMI of 50.0-59.9, adult ([place]) 08/31/2013   SLEEP APNEA 06/16/2010   HYPERCHOLESTEROLEMIA  12/31/2008   Tobacco abuse, in remission 12/31/2008   Essential hypertension 12/31/2008    Past Medical History:  Diagnosis Date   Hyperlipidemia    Hypertension    Obesity     History reviewed. No pertinent surgical history.  Family History  Problem Relation Age of Onset   Heart attack Mother    Coronary artery disease Mother    COPD Father    Diabetes Father     Past Medical History, Surgical History, Social History, Family History, Problem List, Medications, and Allergies have been reviewed and updated if relevant.  Review of Systems: Pertinent positives are listed above.  Otherwise, a full 14 point review of systems has been done in full and it is negative except where it is noted positive.  Objective:   BP 104/60   Pulse 84   Temp 98.3 F (36.8 C) (Temporal)   Ht 5' 4.5" (1.638 m)   Wt 275 lb 4 oz (124.9 kg)   SpO2 96%   BMI 46.52 kg/m  Ideal Body Weight: Weight in (lb) to have BMI = 25: 147.6  Ideal Body Weight: Weight in (lb) to have BMI = 25: 147.6 No results found. Depression screen [facility] 2/9 04/02/2021 03/28/2020 04/04/2019 03/15/2019 02/09/2018  Decreased Interest 0 0 0 0 0  Down, Depressed, Hopeless 0 0 0 0 0  PHQ - 2 Score 0 0 0 0 0     GEN: well developed, well nourished, no acute distress Eyes: conjunctiva and lids normal, PERRLA, EOMI ENT: TM clear, nares clear, oral exam WNL Neck: supple, no lymphadenopathy, no thyromegaly, no JVD Pulm: clear to auscultation and percussion, respiratory effort normal CV: regular rate and rhythm, S1-S2, no murmur, rub or gallop, no bruits, peripheral pulses normal and symmetric, no cyanosis, clubbing, edema or varicosities GI: soft, non-tender; no hepatosplenomegaly, masses; active bowel sounds all quadrants GU: deferred Lymph: no cervical, axillary or inguinal adenopathy MSK: gait normal, muscle tone and strength WNL, no joint swelling, effusions, discoloration, crepitus  SKIN: clear, good turgor, color WNL, no  rashes, lesions, or ulcerations Neuro: normal mental status, normal strength, sensation, and motion Psych: alert; oriented to person, place and time, normally interactive and not anxious or depressed in appearance.  All labs reviewed with patient. Results for orders placed or performed in visit on 04/02/21  POCT glycosylated hemoglobin (Hb A1C)  Result Value Ref Range   Hemoglobin A1C 6.0 (A) 4.0 - 5.6 %   HbA1c POC (<> result, manual entry)     HbA1c, POC (prediabetic range)     HbA1c, POC (controlled diabetic range)      Assessment and Plan:     ICD-10-CM   1. Encounter for health maintenance examination with abnormal findings  Z00.01     2. Diabetes mellitus without complication (HCC)  X73.5 Basic metabolic panel    POCT glycosylated hemoglobin (Hb A1C)    CANCELED: Hemoglobin A1c    3. HYPERCHOLESTEROLEMIA  E78.00 Lipid panel    4. Screening PSA (prostate specific antigen)  Z12.5     5. Screening for malignant neoplasm of prostate  Z12.5 PSA, Total with Reflex to PSA, Free    6. Encounter for long-term (  current) use of medications  Z79.899 CBC with Differential/Platelet    Hepatic function panel     A1c is dramatically better [place] job with weight loss  Shingrix and Flu shots today  Health Maintenance Exam: The patient's preventative maintenance and recommended screening tests for an annual wellness exam were reviewed in full today. Brought up to date unless services declined.  Counselled on the importance of diet, exercise, and its role in overall health and mortality. The patient's FH and SH was reviewed, including their home life, tobacco status, and drug and alcohol status.  Follow-up in 1 year for physical exam or additional follow-up below.  Follow-up: No follow-ups on file. Or follow-up in 1 year if not noted.  No orders of the defined types were placed in this encounter.  Medications Discontinued During This Encounter  Medication Reason   glipiZIDE  (GLUCOTROL XL) 5 MG 24 hr tablet Change in therapy   Orders Placed This Encounter  Procedures   Basic metabolic panel   CBC with Differential/Platelet   Hepatic function panel   Lipid panel   PSA, Total with Reflex to PSA, Free   POCT glycosylated hemoglobin (Hb A1C)    Signed,  Rashay Barnette T. Reubin Bushnell, MD   Allergies as of 04/02/2021   No Known Allergies      Medication List        Accurate as of April 02, 2021  8:41 AM. If you have any questions, ask your nurse or doctor.          acetaminophen 500 MG tablet Commonly known as: TYLENOL Take 500 mg by mouth every 6 (six) hours as needed.   amitriptyline 25 MG tablet Commonly known as: ELAVIL TAKE 1 TABLET(25 MG) BY MOUTH AT BEDTIME AS NEEDED FOR SLEEP   aspirin 81 MG tablet Take 81 mg by mouth daily.   diclofenac 50 MG EC tablet Commonly known as: VOLTAREN Take 50 mg by mouth 2 (two) times daily.   escitalopram 10 MG tablet Commonly known as: LEXAPRO Take 2 tablets (20 mg total) by mouth at bedtime. NEEDS OFFICE VISIT   glipiZIDE 10 MG 24 hr tablet Commonly known as: GLUCOTROL XL Take 1 tablet (10 mg total) by mouth daily with breakfast. What changed: Another medication with the same name was removed. Continue taking this medication, and follow the directions you see here. Changed by: Owens Loffler, MD   lisinopril 20 MG tablet Commonly known as: ZESTRIL TAKE 1 TABLET DAILY   metFORMIN 500 MG 24 hr tablet Commonly known as: GLUCOPHAGE-XR Take 2 tablets (1,000 mg total) by mouth 2 (two) times daily.   multivitamin with minerals tablet Take 1 tablet by mouth daily.   rosuvastatin 40 MG tablet Commonly known as: CRESTOR TAKE 1 TABLET DAILY   sildenafil 100 MG tablet Commonly known as: Viagra Take 0.5-1 tablets (50-100 mg total) by mouth daily as needed for erectile dysfunction.   tadalafil 20 MG tablet Commonly known as: CIALIS Take 0.5-1 tablets (10-20 mg total) by mouth daily as needed for  erectile dysfunction.   tamsulosin 0.4 MG Caps capsule Commonly known as: FLOMAX TAKE 1 CAPSULE DAILY   vitamin C 1000 MG tablet Take 1,000 mg by mouth daily.

## 2021-04-02 ENCOUNTER — Other Ambulatory Visit: Payer: Self-pay

## 2021-04-02 ENCOUNTER — Encounter: Admitting: Family Medicine

## 2021-04-02 ENCOUNTER — Encounter: Payer: Self-pay | Admitting: Family Medicine

## 2021-04-02 ENCOUNTER — Ambulatory Visit (INDEPENDENT_AMBULATORY_CARE_PROVIDER_SITE_OTHER): Admitting: Family Medicine

## 2021-04-02 VITALS — BP 104/60 | HR 84 | Temp 98.3°F | Ht 64.5 in | Wt 275.2 lb

## 2021-04-02 DIAGNOSIS — Z23 Encounter for immunization: Secondary | ICD-10-CM | POA: Diagnosis not present

## 2021-04-02 DIAGNOSIS — Z0000 Encounter for general adult medical examination without abnormal findings: Secondary | ICD-10-CM | POA: Diagnosis not present

## 2021-04-02 DIAGNOSIS — Z125 Encounter for screening for malignant neoplasm of prostate: Secondary | ICD-10-CM | POA: Diagnosis not present

## 2021-04-02 DIAGNOSIS — Z0001 Encounter for general adult medical examination with abnormal findings: Secondary | ICD-10-CM

## 2021-04-02 DIAGNOSIS — Z79899 Other long term (current) drug therapy: Secondary | ICD-10-CM | POA: Diagnosis not present

## 2021-04-02 DIAGNOSIS — E7800 Pure hypercholesterolemia, unspecified: Secondary | ICD-10-CM

## 2021-04-02 DIAGNOSIS — E119 Type 2 diabetes mellitus without complications: Secondary | ICD-10-CM | POA: Diagnosis not present

## 2021-04-02 LAB — CBC WITH DIFFERENTIAL/PLATELET
Basophils Absolute: 0 10*3/uL (ref 0.0–0.1)
Basophils Relative: 0.5 % (ref 0.0–3.0)
Eosinophils Absolute: 0.3 10*3/uL (ref 0.0–0.7)
Eosinophils Relative: 3.8 % (ref 0.0–5.0)
HCT: 44.1 % (ref 39.0–52.0)
Hemoglobin: 14.9 g/dL (ref 13.0–17.0)
Lymphocytes Relative: 25 % (ref 12.0–46.0)
Lymphs Abs: 1.7 10*3/uL (ref 0.7–4.0)
MCHC: 33.7 g/dL (ref 30.0–36.0)
MCV: 87.8 fl (ref 78.0–100.0)
Monocytes Absolute: 0.6 10*3/uL (ref 0.1–1.0)
Monocytes Relative: 8.7 % (ref 3.0–12.0)
Neutro Abs: 4.1 10*3/uL (ref 1.4–7.7)
Neutrophils Relative %: 62 % (ref 43.0–77.0)
Platelets: 235 10*3/uL (ref 150.0–400.0)
RBC: 5.02 Mil/uL (ref 4.22–5.81)
RDW: 14.4 % (ref 11.5–15.5)
WBC: 6.6 10*3/uL (ref 4.0–10.5)

## 2021-04-02 LAB — HEPATIC FUNCTION PANEL
ALT: 30 U/L (ref 0–53)
AST: 21 U/L (ref 0–37)
Albumin: 4.3 g/dL (ref 3.5–5.2)
Alkaline Phosphatase: 58 U/L (ref 39–117)
Bilirubin, Direct: 0.1 mg/dL (ref 0.0–0.3)
Total Bilirubin: 0.6 mg/dL (ref 0.2–1.2)
Total Protein: 7.4 g/dL (ref 6.0–8.3)

## 2021-04-02 LAB — BASIC METABOLIC PANEL
BUN: 28 mg/dL — ABNORMAL HIGH (ref 6–23)
CO2: 27 mEq/L (ref 19–32)
Calcium: 9.7 mg/dL (ref 8.4–10.5)
Chloride: 104 mEq/L (ref 96–112)
Creatinine, Ser: 1.19 mg/dL (ref 0.40–1.50)
GFR: 66.94 mL/min (ref >60.00)
Glucose, Bld: 94 mg/dL (ref 70–99)
Potassium: 4.4 mEq/L (ref 3.5–5.1)
Sodium: 138 mEq/L (ref 135–145)

## 2021-04-02 LAB — POCT GLYCOSYLATED HEMOGLOBIN (HGB A1C): Hemoglobin A1C: 6 % — AB (ref 4.0–5.6)

## 2021-04-02 LAB — LIPID PANEL
Cholesterol: 103 mg/dL (ref 0–200)
HDL: 30.2 mg/dL — ABNORMAL LOW (ref >39.00)
LDL Cholesterol: 55 mg/dL (ref 0–99)
NonHDL: 73.07
Total CHOL/HDL Ratio: 3
Triglycerides: 91 mg/dL (ref 0.0–149.0)
VLDL: 18.2 mg/dL (ref 0.0–40.0)

## 2021-04-02 NOTE — Addendum Note (Signed)
Addended by: Alvina Chou on: 04/02/2021 09:30 AM   Modules accepted: Orders

## 2021-04-02 NOTE — Addendum Note (Signed)
Addended by: Damita Lack on: 04/02/2021 09:34 AM   Modules accepted: Orders

## 2021-04-03 LAB — PSA, TOTAL WITH REFLEX TO PSA, FREE: PSA, Total: 0.5 ng/mL (ref <=4.0)

## 2021-05-14 ENCOUNTER — Other Ambulatory Visit: Payer: Self-pay | Admitting: Family Medicine

## 2021-05-19 ENCOUNTER — Other Ambulatory Visit: Payer: Self-pay | Admitting: Family Medicine

## 2021-06-06 ENCOUNTER — Other Ambulatory Visit: Payer: Self-pay | Admitting: Family Medicine

## 2021-06-10 ENCOUNTER — Other Ambulatory Visit: Payer: Self-pay | Admitting: Family Medicine

## 2021-07-17 ENCOUNTER — Ambulatory Visit

## 2021-07-25 ENCOUNTER — Other Ambulatory Visit: Payer: Self-pay | Admitting: Family Medicine

## 2021-07-29 ENCOUNTER — Ambulatory Visit (INDEPENDENT_AMBULATORY_CARE_PROVIDER_SITE_OTHER)

## 2021-07-29 ENCOUNTER — Other Ambulatory Visit: Payer: Self-pay

## 2021-07-29 DIAGNOSIS — Z23 Encounter for immunization: Secondary | ICD-10-CM | POA: Diagnosis not present

## 2021-07-29 NOTE — Progress Notes (Signed)
Per orders of Dr. Bedsole, 2nd injection of shingrix given by Alexea Blase G Chan Sheahan. ?Patient tolerated injection well.  ?

## 2021-08-29 ENCOUNTER — Other Ambulatory Visit: Payer: Self-pay | Admitting: Family Medicine

## 2021-08-30 IMAGING — CR DG CHEST 2V
1 series · 3 of 3 positions shown · non-contrast
Comparison: December 24, 2009

CLINICAL DATA: Chills and body aches.

EXAM:
CHEST - 2 VIEW

[Series 1: dg chest 2 view · 0.14mm/px · 3 of 3 slices shown]
[im 1/3]
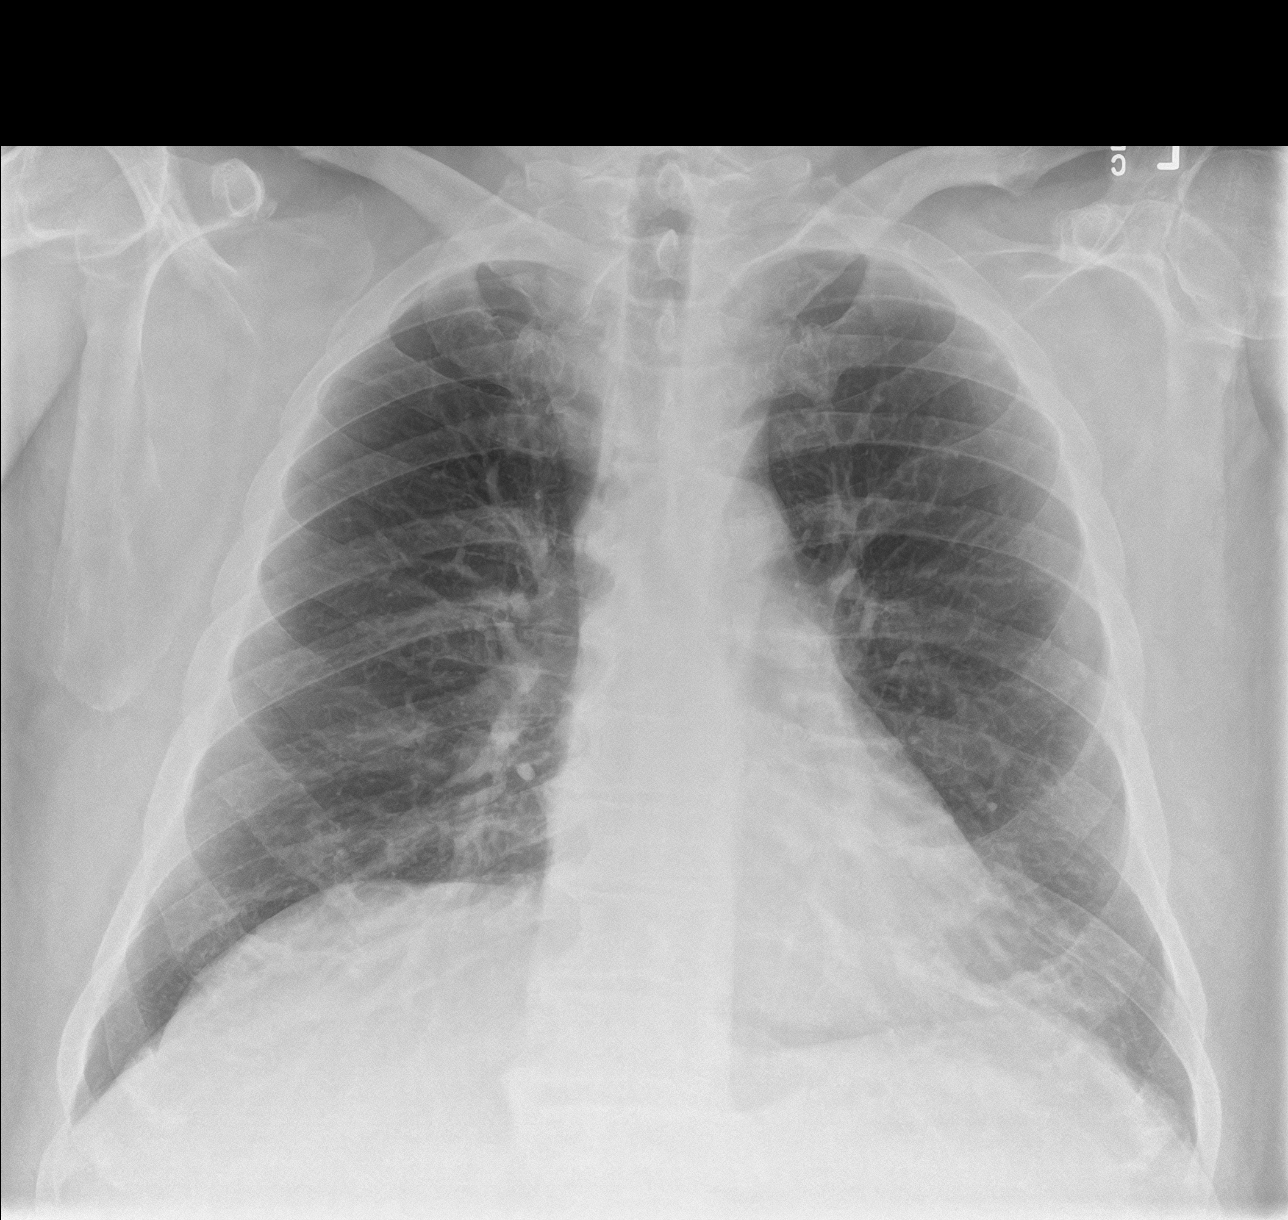
[im 2/3]
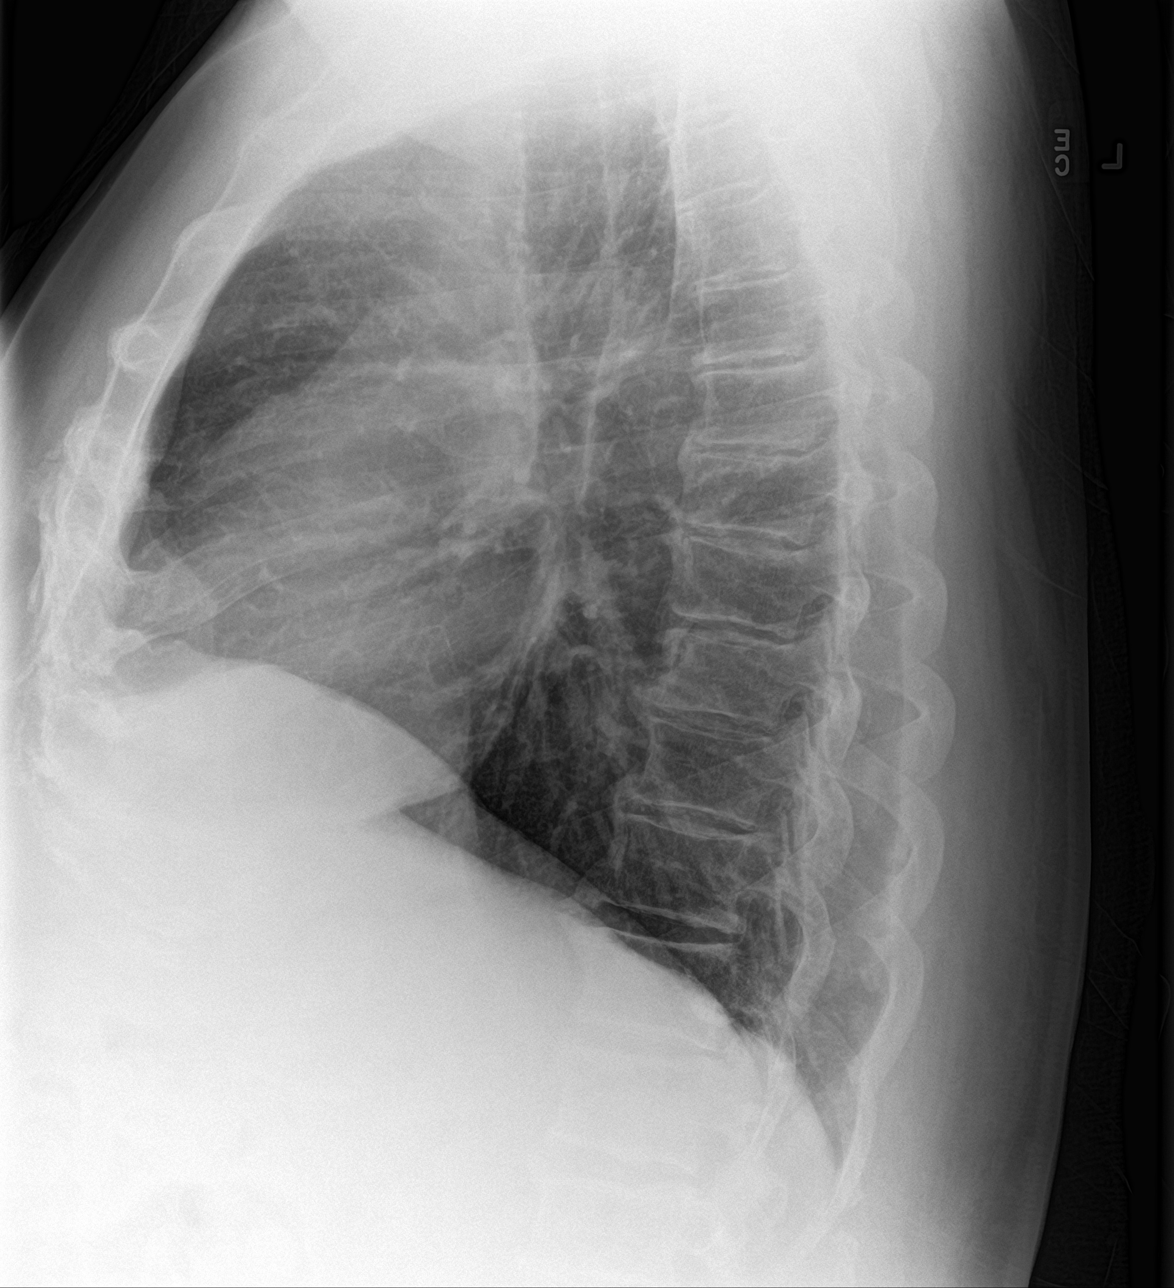
[im 3/3]
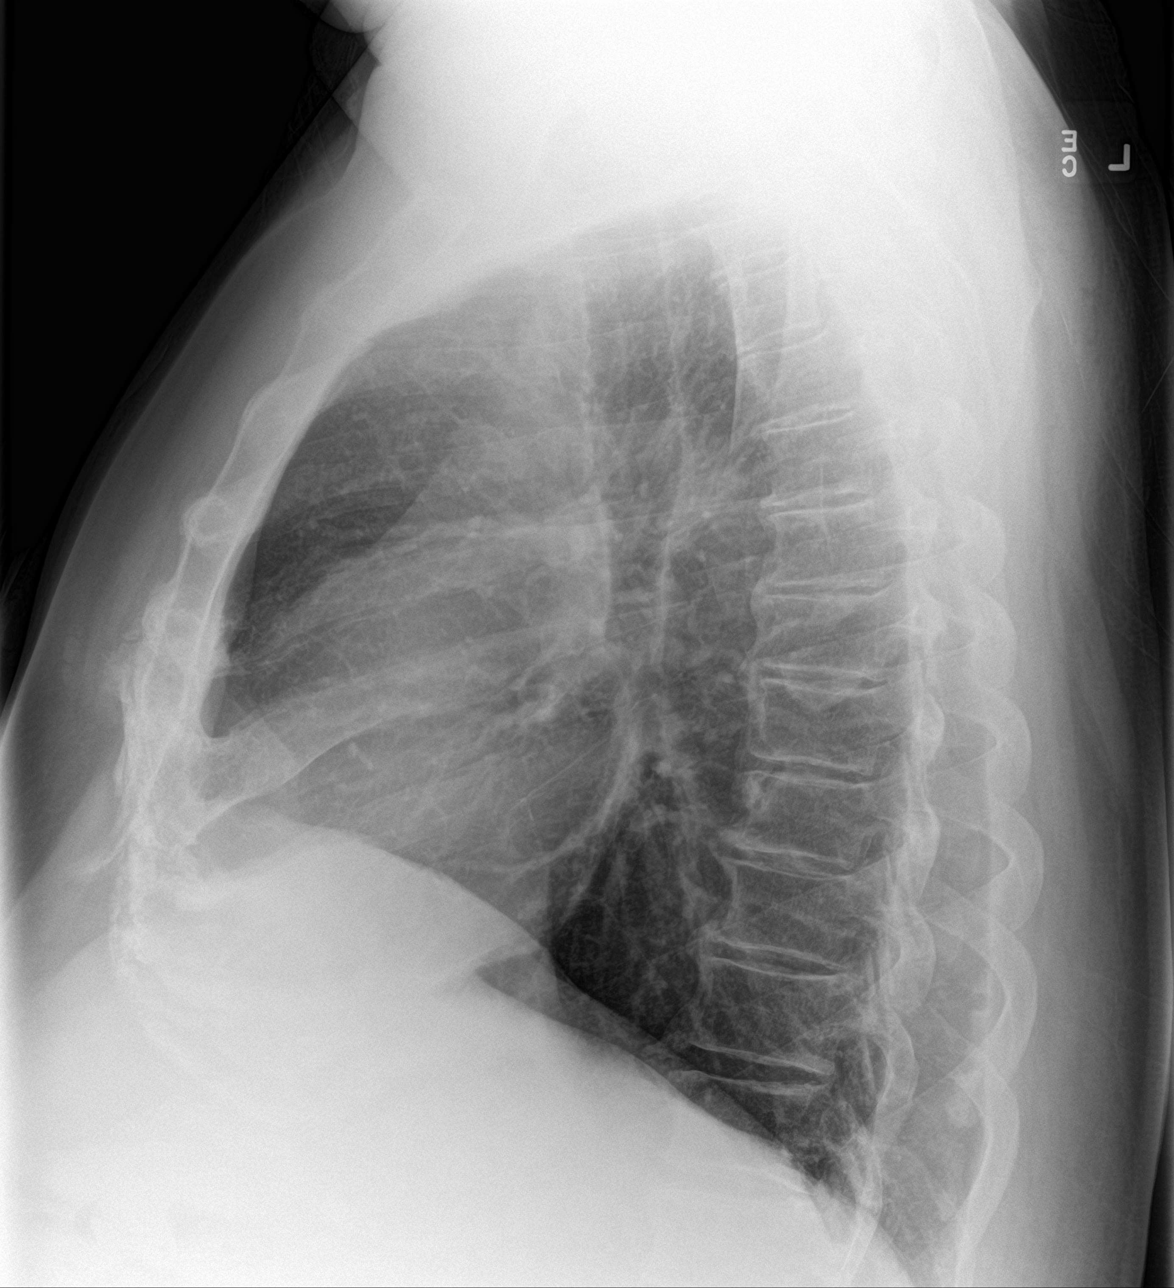

[3 of 3 positions shown; findings below may reference images not displayed]

FINDINGS: There is no evidence of acute infiltrate, pleural effusion or
pneumothorax. The heart size and mediastinal contours are within
normal limits. Multilevel degenerative changes seen throughout the
thoracic spine.
IMPRESSION: No active cardiopulmonary disease.

## 2021-09-17 ENCOUNTER — Other Ambulatory Visit: Payer: Self-pay | Admitting: *Deleted

## 2021-09-17 MED ORDER — GLIPIZIDE ER 10 MG PO TB24: ORAL_TABLET | ORAL | 1 refills | Status: DC

## 2021-11-11 ENCOUNTER — Telehealth: Admitting: Adult Health

## 2021-12-02 ENCOUNTER — Other Ambulatory Visit: Payer: Self-pay | Admitting: Family Medicine

## 2022-01-05 ENCOUNTER — Encounter: Payer: Self-pay | Admitting: *Deleted

## 2022-01-05 NOTE — Progress Notes (Unsigned)
  [facility] [address] [place]. [place] [address] [phone]  PRIMARY NEUROLOGIST:    Virtual Visit via Telephone Note  I connected with Kevin [name] on 01/06/22 at  8:45 AM EDT by telephone located remotely at [facility] and verified that I am speaking with the correct person using two identifiers who reports being located at home   Visit scheduled by [facility] staff. She discussed the limitations, risks, security and privacy concerns of performing an evaluation and management service by telephone and the availability of in person appointments. I also discussed with the patient that there may be a patient responsible charge related to this service. The patient expressed understanding and agreed to proceed. See telephone note for consent and additional scheduling information.    History of Present Illness:  Kevin [name] is a 60 y.o. male who has been followed in this office for OSA on CPAP.  He was originally scheduled for a video visit however once we connected the camera was not working for him.  We transition to just a telephone visit.  He reports that the CPAP continues to work well for him.  He does notice the benefit.  His download shows a high leak but he does not notice the mask leaking.  He states that he is due to change out his supplies.  His download is below       Observations/Objective:  Generalized: Well developed, in no acute distress   Neurological examination  Mentation: Alert oriented to time, place, history taking. Follows all commands speech and language fluent  Assessment and Plan:  1: Obstructive sleep apnea on CPAP  Good compliance Good treatment of apnea Encouraged the patient to change out his supplies, if his mask continues to leak may need a mask refitting Encouraged the patient to use his machine nightly and greater than 4 hours each night    Follow Up Instructions:   F/U in 1 year or sooner if  needed    I discussed the assessment and treatment plan with the patient.  The patient was provided an opportunity to ask questions and all were answered to their satisfaction. The patient agreed with the plan and verbalized an understanding of the instructions.   I provided 7 minutes of non-face-to-face time during this encounter.    Butch Penny NP-C  [facility] [address] [place], [place] [address]  Phone [phone] Fax [phone] \

## 2022-01-06 ENCOUNTER — Telehealth (INDEPENDENT_AMBULATORY_CARE_PROVIDER_SITE_OTHER): Admitting: Adult Health

## 2022-01-06 DIAGNOSIS — Z9989 Dependence on other enabling machines and devices: Secondary | ICD-10-CM | POA: Diagnosis not present

## 2022-01-06 DIAGNOSIS — G4733 Obstructive sleep apnea (adult) (pediatric): Secondary | ICD-10-CM

## 2022-02-24 ENCOUNTER — Other Ambulatory Visit: Payer: Self-pay | Admitting: Family Medicine

## 2022-04-08 ENCOUNTER — Encounter: Admitting: Family Medicine

## 2022-05-03 ENCOUNTER — Other Ambulatory Visit: Payer: Self-pay | Admitting: Family Medicine

## 2022-05-03 DIAGNOSIS — E7800 Pure hypercholesterolemia, unspecified: Secondary | ICD-10-CM

## 2022-05-03 DIAGNOSIS — E119 Type 2 diabetes mellitus without complications: Secondary | ICD-10-CM

## 2022-05-03 DIAGNOSIS — Z125 Encounter for screening for malignant neoplasm of prostate: Secondary | ICD-10-CM

## 2022-05-03 DIAGNOSIS — Z79899 Other long term (current) drug therapy: Secondary | ICD-10-CM

## 2022-05-06 ENCOUNTER — Other Ambulatory Visit (INDEPENDENT_AMBULATORY_CARE_PROVIDER_SITE_OTHER)

## 2022-05-06 DIAGNOSIS — Z79899 Other long term (current) drug therapy: Secondary | ICD-10-CM

## 2022-05-06 DIAGNOSIS — E119 Type 2 diabetes mellitus without complications: Secondary | ICD-10-CM | POA: Diagnosis not present

## 2022-05-06 DIAGNOSIS — Z125 Encounter for screening for malignant neoplasm of prostate: Secondary | ICD-10-CM

## 2022-05-06 DIAGNOSIS — E7800 Pure hypercholesterolemia, unspecified: Secondary | ICD-10-CM | POA: Diagnosis not present

## 2022-05-06 LAB — CBC WITH DIFFERENTIAL/PLATELET
Basophils Absolute: 0 10*3/uL (ref 0.0–0.1)
Basophils Relative: 0.3 % (ref 0.0–3.0)
Eosinophils Absolute: 0.2 10*3/uL (ref 0.0–0.7)
Eosinophils Relative: 2.5 % (ref 0.0–5.0)
HCT: 44.4 % (ref 39.0–52.0)
Hemoglobin: 15.2 g/dL (ref 13.0–17.0)
Lymphocytes Relative: 18.4 % (ref 12.0–46.0)
Lymphs Abs: 1.7 10*3/uL (ref 0.7–4.0)
MCHC: 34.2 g/dL (ref 30.0–36.0)
MCV: 88 fl (ref 78.0–100.0)
Monocytes Absolute: 0.8 10*3/uL (ref 0.1–1.0)
Monocytes Relative: 9.1 % (ref 3.0–12.0)
Neutro Abs: 6.4 10*3/uL (ref 1.4–7.7)
Neutrophils Relative %: 69.7 % (ref 43.0–77.0)
Platelets: 239 10*3/uL (ref 150.0–400.0)
RBC: 5.04 Mil/uL (ref 4.22–5.81)
RDW: 14 % (ref 11.5–15.5)
WBC: 9.2 10*3/uL (ref 4.0–10.5)

## 2022-05-06 LAB — HEPATIC FUNCTION PANEL
ALT: 24 U/L (ref 0–53)
AST: 19 U/L (ref 0–37)
Albumin: 4.1 g/dL (ref 3.5–5.2)
Alkaline Phosphatase: 62 U/L (ref 39–117)
Bilirubin, Direct: 0.1 mg/dL (ref 0.0–0.3)
Total Bilirubin: 0.6 mg/dL (ref 0.2–1.2)
Total Protein: 6.7 g/dL (ref 6.0–8.3)

## 2022-05-06 LAB — MICROALBUMIN / CREATININE URINE RATIO
Creatinine,U: 119.6 mg/dL
Microalb Creat Ratio: 126.4 mg/g — ABNORMAL HIGH (ref 0.0–30.0)
Microalb, Ur: 151.1 mg/dL — ABNORMAL HIGH (ref 0.0–1.9)

## 2022-05-06 LAB — LIPID PANEL
Cholesterol: 106 mg/dL (ref 0–200)
HDL: 28.8 mg/dL — ABNORMAL LOW (ref >39.00)
LDL Cholesterol: 42 mg/dL (ref 0–99)
NonHDL: 77.13
Total CHOL/HDL Ratio: 4
Triglycerides: 176 mg/dL — ABNORMAL HIGH (ref 0.0–149.0)
VLDL: 35.2 mg/dL (ref 0.0–40.0)

## 2022-05-06 LAB — BASIC METABOLIC PANEL
BUN: 15 mg/dL (ref 6–23)
CO2: 27 mEq/L (ref 19–32)
Calcium: 9.8 mg/dL (ref 8.4–10.5)
Chloride: 105 mEq/L (ref 96–112)
Creatinine, Ser: 0.85 mg/dL (ref 0.40–1.50)
GFR: 94.5 mL/min (ref >60.00)
Glucose, Bld: 114 mg/dL — ABNORMAL HIGH (ref 70–99)
Potassium: 4.3 mEq/L (ref 3.5–5.1)
Sodium: 141 mEq/L (ref 135–145)

## 2022-05-06 LAB — HEMOGLOBIN A1C: Hgb A1c MFr Bld: 6.5 % (ref 4.6–6.5)

## 2022-05-08 LAB — PSA, TOTAL WITH REFLEX TO PSA, FREE: PSA, Total: 0.4 ng/mL (ref <=4.0)

## 2022-05-12 NOTE — Progress Notes (Unsigned)
Kevin [name] T. Olamide Carattini, MD, [place] at [facility] [place], [address]  Phone: [phone]  FAX: [phone]  Kevin [name] - 61 y.o. male  MRN [id number]  Date of Birth: 1961/03/04  Date: 05/13/2022  PCP: Owens Loffler, MD  Referral: Owens Loffler, MD  No chief complaint on file.  Patient Care Team: Owens Loffler, MD as PCP - General Subjective:   Kevin [name] is a 61 y.o. pleasant patient who presents with the following:  Preventative Health Maintenance Visit:  Health Maintenance Summary Reviewed and updated, unless pt declines services.  Tobacco History Reviewed. Alcohol: No concerns, no excessive use Exercise Habits: Some activity, rec at least 30 mins 5 times a week STD concerns: no risk or activity to increase risk Drug Use: None  Foot exam Check eye md Flu Covid booster  Diabetes Mellitus: Tolerating Medications: yes Compliance with diet: fair, There is no height or weight on file to calculate BMI. Exercise: minimal / intermittent Avg blood sugars at home: not checking Foot problems: none Hypoglycemia: none No nausea, vomitting, blurred vision, polyuria.  Lab Results  Component Value Date   HGBA1C 6.5 05/06/2022   HGBA1C 6.0 (A) 04/02/2021   HGBA1C 10.7 (A) 11/06/2020   Lab Results  Component Value Date   MICROALBUR 151.1 (H) 05/06/2022   LDLCALC 42 05/06/2022   CREATININE 0.85 05/06/2022    Wt Readings from Last 3 Encounters:  04/02/21 275 lb 4 oz (124.9 kg)  11/07/20 290 lb (131.5 kg)  11/06/20 289 lb (131.1 kg)     Health Maintenance  Topic Date Due   FOOT EXAM  Never done   OPHTHALMOLOGY EXAM  09/19/2020   INFLUENZA VACCINE  12/23/2021   COVID-19 Vaccine (4 - 2023-24 season) 01/23/2022   HEMOGLOBIN A1C  11/05/2022   Diabetic kidney evaluation - eGFR measurement  05/07/2023   Diabetic kidney evaluation - Urine ACR  05/07/2023   COLONOSCOPY (Pts 45-39yr  Insurance coverage will need to be confirmed)  12/23/2023   DTaP/Tdap/Td (5 - Td or Tdap) 02/02/2027   Hepatitis C Screening  Completed   HIV Screening  Completed   Zoster Vaccines- Shingrix  Completed   HPV VACCINES  Aged Out   Immunization History  Administered Date(s) Administered   Anthrax 03/19/2006, 05/24/2006, 06/07/2006   Hepatitis A, Adult 03/25/1997, 09/22/1997   Hepatitis B, adult 08/31/1997, 10/02/1997, 04/14/2002   IPV 08/03/1984, 11/17/2002   Influenza Nasal 04/10/2005, 04/08/2007   Influenza Split 03/30/2000, 06/09/2001, 03/24/2002, 01/24/2004, 03/15/2006   Influenza,inj,Quad PF,6+ Mos 01/16/2015, 02/01/2017, 02/09/2018, 03/15/2019, 03/28/2020, 04/02/2021   MMR 08/13/1994   Meningococcal Polysaccharide 01/31/2003   PFIZER(Purple Top)SARS-COV-2 Vaccination 08/23/2019, 09/13/2019, 04/26/2020   Pneumococcal Polysaccharide-23 03/15/2019   Rabies Immune Globulin 05/24/2006, 06/25/2006   Rabies, intradermal 06/07/2006   Smallpox 03/19/2006   Td 03/25/1997, 10/23/2005, 03/04/2007   Tdap 02/01/2017   Typhoid Inactivated 04/14/2005, 03/04/2007   Typhoid Parenteral 11/17/2002   Varicella 04/14/2005, 07/20/2005   Yellow Fever 08/23/1997, 05/25/2007   Zoster Recombinat (Shingrix) 04/02/2021, 07/29/2021   Patient Active Problem List   Diagnosis Date Noted   Diabetes mellitus without complication ([place] 145/62/5638   Priority: High   OSA on CPAP 04/27/2016    Priority: Medium    Morbid obesity with BMI of 50.0-59.9, adult ([place] 08/31/2013    Priority: Medium    HYPERCHOLESTEROLEMIA 12/31/2008    Priority: Medium    Essential hypertension 12/31/2008    Priority: Medium    Generalized  anxiety disorder 10/01/2014   Hypersomnia with sleep apnea 07/16/2014   Tobacco abuse, 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  Family History  Problem Relation Age of Onset    Heart attack Mother    Coronary artery disease Mother    COPD Father    Diabetes Father     Social History   Social History Narrative   [employer]-2009, E[employer]   Caffeine 4-5 cups daily avg.   Married no kids.    [place], now [facility]    Past Medical History, Surgical History, Social History, Family History, Problem List, Medications, and Allergies have been reviewed and updated if relevant.  Review of Systems: Pertinent positives are listed above.  Otherwise, a full 14 point review of systems has been done in full and it is negative except where it is noted positive.  Objective:   There were no vitals taken for this visit. Ideal Body Weight:    Ideal Body Weight:   No results found.    04/02/2021    8:25 AM 03/28/2020    8:24 AM 04/04/2019    3:24 PM 03/15/2019    8:57 AM 02/09/2018    9:18 AM  Depression screen PHQ 2/9  Decreased Interest 0 0 0 0 0  Down, Depressed, Hopeless 0 0 0 0 0  PHQ - 2 Score 0 0 0 0 0     GEN: well developed, well nourished, no acute distress Eyes: conjunctiva and lids normal, PERRLA, EOMI ENT: TM clear, nares clear, oral exam WNL Neck: supple, no lymphadenopathy, no thyromegaly, no JVD Pulm: clear to auscultation and percussion, respiratory effort normal CV: regular rate and rhythm, S1-S2, no murmur, rub or gallop, no bruits, peripheral pulses normal and symmetric, no cyanosis, clubbing, edema or varicosities GI: soft, non-tender; no hepatosplenomegaly, masses; active bowel sounds all quadrants GU: deferred Lymph: no cervical, axillary or inguinal adenopathy MSK: gait normal, muscle tone and strength WNL, no joint swelling, effusions, discoloration, crepitus  SKIN: clear, good turgor, color WNL, no rashes, lesions, or ulcerations Neuro: normal mental status, normal strength, sensation, and motion Psych: alert; oriented to person, place and time, normally interactive and not anxious or depressed in appearance.  All labs reviewed with  patient. Results for orders placed or performed in visit on 05/06/22  PSA, Total with Reflex to PSA, Free  Result Value Ref Range   PSA, Total 0.4 < OR = 4.0 ng/mL  Lipid panel  Result Value Ref Range   Cholesterol 106 0 - 200 mg/dL   Triglycerides 176.0 (H) 0.0 - 149.0 mg/dL   HDL 28.80 (L) >39.00 mg/dL   VLDL 35.2 0.0 - 40.0 mg/dL   LDL Cholesterol 42 0 - 99 mg/dL   Total CHOL/HDL Ratio 4    NonHDL 77.13   Microalbumin / creatinine urine ratio  Result Value Ref Range   Microalb, Ur 151.1 (H) 0.0 - 1.9 mg/dL   Creatinine,U 119.6 mg/dL   Microalb Creat Ratio 126.4 (H) 0.0 - 30.0 mg/g  Hemoglobin A1c  Result Value Ref Range   Hgb A1c MFr Bld 6.5 4.6 - 6.5 %  Hepatic function panel  Result Value Ref Range   Total Bilirubin 0.6 0.2 - 1.2 mg/dL   Bilirubin, Direct 0.1 0.0 - 0.3 mg/dL   Alkaline Phosphatase 62 39 - 117 U/L   AST 19 0 -  37 U/L   ALT 24 0 - 53 U/L   Total Protein 6.7 6.0 - 8.3 g/dL   Albumin 4.1 3.5 - 5.2 g/dL  CBC with Differential/Platelet  Result Value Ref Range   WBC 9.2 4.0 - 10.5 K/uL   RBC 5.04 4.22 - 5.81 Mil/uL   Hemoglobin 15.2 13.0 - 17.0 g/dL   HCT 44.4 39.0 - 52.0 %   MCV 88.0 78.0 - 100.0 fl   MCHC 34.2 30.0 - 36.0 g/dL   RDW 14.0 11.5 - 15.5 %   Platelets 239.0 150.0 - 400.0 K/uL   Neutrophils Relative % 69.7 43.0 - 77.0 %   Lymphocytes Relative 18.4 12.0 - 46.0 %   Monocytes Relative 9.1 3.0 - 12.0 %   Eosinophils Relative 2.5 0.0 - 5.0 %   Basophils Relative 0.3 0.0 - 3.0 %   Neutro Abs 6.4 1.4 - 7.7 K/uL   Lymphs Abs 1.7 0.7 - 4.0 K/uL   Monocytes Absolute 0.8 0.1 - 1.0 K/uL   Eosinophils Absolute 0.2 0.0 - 0.7 K/uL   Basophils Absolute 0.0 0.0 - 0.1 K/uL  Basic metabolic panel  Result Value Ref Range   Sodium 141 135 - 145 mEq/L   Potassium 4.3 3.5 - 5.1 mEq/L   Chloride 105 96 - 112 mEq/L   CO2 27 19 - 32 mEq/L   Glucose, Bld 114 (H) 70 - 99 mg/dL   BUN 15 6 - 23 mg/dL   Creatinine, Ser 0.85 0.40 - 1.50 mg/dL   GFR 94.50 >60.00  mL/min   Calcium 9.8 8.4 - 10.5 mg/dL    Assessment and Plan:     ICD-10-CM   1. Healthcare maintenance  Z00.00       Health Maintenance Exam: The patient's preventative maintenance and recommended screening tests for an annual wellness exam were reviewed in full today. Brought up to date unless services declined.  Counselled on the importance of diet, exercise, and its role in overall health and mortality. The patient's FH and SH was reviewed, including their home life, tobacco status, and drug and alcohol status.  Follow-up in 1 year for physical exam or additional follow-up below.  Disposition: No follow-ups on file.  No orders of the defined types were placed in this encounter.  There are no discontinued medications. No orders of the defined types were placed in this encounter.   Signed,  Maud Deed. Britley Gashi, MD   Allergies as of 05/13/2022   No Known Allergies      Medication List        Accurate as of May 12, 2022  8:40 AM. If you have any questions, ask your nurse or doctor.          acetaminophen 500 MG tablet Commonly known as: TYLENOL Take 500 mg by mouth every 6 (six) hours as needed.   amitriptyline 25 MG tablet Commonly known as: ELAVIL TAKE 1 TABLET(25 MG) BY MOUTH AT BEDTIME AS NEEDED FOR SLEEP   aspirin 81 MG tablet Take 81 mg by mouth daily.   diclofenac 50 MG EC tablet Commonly known as: VOLTAREN Take 50 mg by mouth 2 (two) times daily.   escitalopram 10 MG tablet Commonly known as: LEXAPRO TAKE 2 TABLETS AT BEDTIME (NEED OFFICE VISIT)   glipiZIDE 10 MG 24 hr tablet Commonly known as: GLUCOTROL XL TAKE 1 TABLET(10 MG) BY MOUTH DAILY WITH BREAKFAST   lisinopril 20 MG tablet Commonly known as: ZESTRIL TAKE 1 TABLET DAILY   metFORMIN 500 MG  24 hr tablet Commonly known as: GLUCOPHAGE-XR TAKE 1 TABLET DAILY WITH BREAKFAST   multivitamin with minerals tablet Take 1 tablet by mouth daily.   rosuvastatin 40 MG  tablet Commonly known as: CRESTOR TAKE 1 TABLET DAILY   sildenafil 100 MG tablet Commonly known as: Viagra Take 0.5-1 tablets (50-100 mg total) by mouth daily as needed for erectile dysfunction.   tadalafil 20 MG tablet Commonly known as: CIALIS Take 0.5-1 tablets (10-20 mg total) by mouth daily as needed for erectile dysfunction.   tamsulosin 0.4 MG Caps capsule Commonly known as: FLOMAX TAKE 1 CAPSULE DAILY   vitamin C 1000 MG tablet Take 1,000 mg by mouth daily.

## 2022-05-13 ENCOUNTER — Ambulatory Visit (INDEPENDENT_AMBULATORY_CARE_PROVIDER_SITE_OTHER): Admitting: Family Medicine

## 2022-05-13 ENCOUNTER — Encounter: Payer: Self-pay | Admitting: Family Medicine

## 2022-05-13 VITALS — BP 110/70 | HR 76 | Temp 97.9°F | Ht 65.0 in | Wt 287.4 lb

## 2022-05-13 DIAGNOSIS — Z0000 Encounter for general adult medical examination without abnormal findings: Secondary | ICD-10-CM | POA: Diagnosis not present

## 2022-05-13 DIAGNOSIS — Z23 Encounter for immunization: Secondary | ICD-10-CM | POA: Diagnosis not present

## 2022-05-13 MED ORDER — TAMSULOSIN HCL 0.4 MG PO CAPS: 0.4000 mg | ORAL_CAPSULE | Freq: Every day | ORAL | 3 refills | Status: DC

## 2022-05-13 MED ORDER — ESCITALOPRAM OXALATE 10 MG PO TABS: ORAL_TABLET | ORAL | 3 refills | Status: DC

## 2022-05-13 MED ORDER — LISINOPRIL 20 MG PO TABS: 20.0000 mg | ORAL_TABLET | Freq: Every day | ORAL | 3 refills | Status: DC

## 2022-05-13 MED ORDER — AMITRIPTYLINE HCL 25 MG PO TABS: ORAL_TABLET | ORAL | 3 refills | Status: DC

## 2022-05-13 MED ORDER — ROSUVASTATIN CALCIUM 40 MG PO TABS: 40.0000 mg | ORAL_TABLET | Freq: Every day | ORAL | 3 refills | Status: DC

## 2022-05-13 MED ORDER — GLIPIZIDE ER 10 MG PO TB24: ORAL_TABLET | ORAL | 3 refills | Status: DC

## 2022-05-13 NOTE — Addendum Note (Signed)
Addended by: Damita Lack on: 05/13/2022 09:01 AM   Modules accepted: Orders

## 2022-05-18 ENCOUNTER — Other Ambulatory Visit: Payer: Self-pay | Admitting: Family Medicine

## 2022-06-18 LAB — HM DIABETES EYE EXAM

## 2023-01-21 ENCOUNTER — Telehealth: Payer: Self-pay

## 2023-01-21 NOTE — Telephone Encounter (Signed)
Call to patient, he verbalized understanding of video visit tomorrow with Aundra Millet and agrees to take machine to adapt to have card download today.

## 2023-01-22 ENCOUNTER — Telehealth: Admitting: Adult Health

## 2023-01-22 DIAGNOSIS — G4733 Obstructive sleep apnea (adult) (pediatric): Secondary | ICD-10-CM

## 2023-01-22 NOTE — Progress Notes (Signed)
PATIENT: Kevin [name] DOB: [birth year]  REASON FOR VISIT: follow up HISTORY FROM: patient   Virtual Visit via Video Note  I connected with [name] on 01/22/23 at  9:00 AM EDT by a video enabled telemedicine application located remotely at [facility] and verified that I am speaking with the correct person using two identifiers who was located at their own home.   I discussed the limitations of evaluation and management by telemedicine and the availability of in person appointments. The patient expressed understanding and agreed to proceed.   PATIENT: [name] DOB: 15-Feb-1961  REASON FOR VISIT: follow up HISTORY FROM: patient  HISTORY OF PRESENT ILLNESS: Today 01/22/23:  [name] is a 62 y.o. male with a history of OSA on CPAP. Returns today for follow-up.  Overall he reports that the CPAP is working well.  He does have 2 L of oxygen bled into his CPAP at night.  Denies any new issues.  Download is below      REVIEW OF SYSTEMS: Out of a complete 14 system review of symptoms, the patient complains only of the following symptoms, and all other reviewed systems are negative.  ALLERGIES: No Known Allergies  HOME MEDICATIONS: Outpatient Medications Prior to Visit  Medication Sig Dispense Refill   acetaminophen (TYLENOL) 500 MG tablet Take 500 mg by mouth every 6 (six) hours as needed.     amitriptyline (ELAVIL) 25 MG tablet TAKE 1 TABLET(25 MG) BY MOUTH AT BEDTIME AS NEEDED FOR SLEEP 90 tablet 3   Ascorbic Acid (VITAMIN C) 1000 MG tablet Take 1,000 mg by mouth daily.     aspirin 81 MG tablet Take 81 mg by mouth daily.       diclofenac (VOLTAREN) 50 MG EC tablet Take 50 mg by mouth 2 (two) times daily.     escitalopram (LEXAPRO) 10 MG tablet TAKE 2 TABLETS AT BEDTIME 180 tablet 3   glipiZIDE (GLUCOTROL XL) 10 MG 24 hr tablet TAKE 1 TABLET(10 MG) BY MOUTH DAILY WITH BREAKFAST 90 tablet 3   lisinopril (ZESTRIL) 20 MG tablet  Take 1 tablet (20 mg total) by mouth daily. 90 tablet 3   Multiple Vitamins-Minerals (MULTIVITAMIN WITH MINERALS) tablet Take 1 tablet by mouth daily.     rosuvastatin (CRESTOR) 40 MG tablet Take 1 tablet (40 mg total) by mouth daily. 90 tablet 3   tamsulosin (FLOMAX) 0.4 MG CAPS capsule Take 1 capsule (0.4 mg total) by mouth daily. 90 capsule 3   No facility-administered medications prior to visit.    PAST MEDICAL HISTORY: Past Medical History:  Diagnosis Date   Hyperlipidemia    Hypertension    Obesity     PAST SURGICAL HISTORY: No past surgical history on file.  FAMILY HISTORY: Family History  Problem Relation Age of Onset   Heart attack Mother    Coronary artery disease Mother    COPD Father    Diabetes Father     SOCIAL HISTORY: Social History   Socioeconomic History   Marital status: Married    Spouse name: Debbie   Number of children: Not on file   Years of education: Not on file   Highest education level: Not on file  Occupational History   Occupation: MILITARY    Employer: [employer]    Employer: [employer]    Employer: [employer]    Comment: (bought out [employer])  Tobacco Use   Smoking status: Former    Current packs/day: 0.00  Average packs/day: 1 pack/day for 15.0 years (15.0 ttl pk-yrs)    Types: Cigarettes    Start date: 06/28/2003    Quit date: 06/27/2018    Years since quitting: 4.5   Smokeless tobacco: Never  Substance and Sexual Activity   Alcohol use: No    Alcohol/week: 0.0 standard drinks of alcohol   Drug use: No   Sexual activity: Not on file  Other Topics Concern   Not on file  Social History Narrative   Former [employer] '86-2009, E7   Caffeine 4-5 cups daily avg.   Married no kids.    [employer], now [employer]   Social Determinants of Health   Financial Resource Strain: Not on file  Food Insecurity: Not on file  Transportation Needs: Not on file  Physical Activity: Not on file  Stress: Not on file  Social Connections: Not on file  Intimate  Partner Violence: Not on file      PHYSICAL EXAM Generalized: Well developed, in no acute distress   Neurological examination  Mentation: Alert oriented to time, place, history taking. Follows all commands speech and language fluent Cranial nerve II-XII: Facial symmetry noted  DIAGNOSTIC DATA (LABS, IMAGING, TESTING) - I reviewed patient records, labs, notes, testing and imaging myself where available.  Lab Results  Component Value Date   WBC 9.2 05/06/2022   HGB 15.2 05/06/2022   HCT 44.4 05/06/2022   MCV 88.0 05/06/2022   PLT 239.0 05/06/2022      Component Value Date/Time   NA 141 05/06/2022 0912   K 4.3 05/06/2022 0912   CL 105 05/06/2022 0912   CO2 27 05/06/2022 0912   GLUCOSE 114 (H) 05/06/2022 0912   BUN 15 05/06/2022 0912   CREATININE 0.85 05/06/2022 0912   CALCIUM 9.8 05/06/2022 0912   PROT 6.7 05/06/2022 0912   ALBUMIN 4.1 05/06/2022 0912   AST 19 05/06/2022 0912   ALT 24 05/06/2022 0912   ALKPHOS 62 05/06/2022 0912   BILITOT 0.6 05/06/2022 0912   GFRNONAA 110.09 12/31/2008 0936   Lab Results  Component Value Date   CHOL 106 05/06/2022   HDL 28.80 (L) 05/06/2022   LDLCALC 42 05/06/2022   LDLDIRECT 55.0 03/10/2019   TRIG 176.0 (H) 05/06/2022   CHOLHDL 4 05/06/2022   Lab Results  Component Value Date   HGBA1C 6.5 05/06/2022     ASSESSMENT AND PLAN 62 y.o. year old male  has a past medical history of Hyperlipidemia, Hypertension, and Obesity. here with:  OSA on CPAP  CPAP compliance excellent Residual AHI is good Encouraged patient to continue using CPAP nightly and > 4 hours each night F/U in 1 year or sooner if needed    Butch Penny, MSN, NP-C 01/22/2023, 8:54 AM [facility] [address], [address] [place], [place] [address] [phone]

## 2023-02-14 NOTE — Progress Notes (Unsigned)
Alanni Vader T. Aleida Crandell, MD, CAQ Sports Medicine [facility] at [facility] [address] [place], [address]  Phone: [phone]  FAX: [phone]  Kevin [name] - 62 y.o. male  MRN [id number]  Date of Birth: 1961-01-04  Date: 02/15/2023  PCP: Hannah Beat, MD  Referral: Hannah Beat, MD  No chief complaint on file.  Subjective:   Kevin [name] is a 62 y.o. very pleasant male patient with There is no height or weight on file to calculate BMI. who presents with the following:  He is here for diabetes and hypertension follow-up.  Diabetes Mellitus: Tolerating Medications: yes Compliance with diet: fair, There is no height or weight on file to calculate BMI. Exercise: minimal / intermittent Avg blood sugars at home: not checking Foot problems: none Hypoglycemia: none No nausea, vomitting, blurred vision, polyuria.  Lab Results  Component Value Date   HGBA1C 6.5 05/06/2022   HGBA1C 6.0 (A) 04/02/2021   HGBA1C 10.7 (A) 11/06/2020   Lab Results  Component Value Date   MICROALBUR 151.1 (H) 05/06/2022   LDLCALC 42 05/06/2022   CREATININE 0.85 05/06/2022    Wt Readings from Last 3 Encounters:  05/13/22 287 lb 6 oz (130.4 kg)  04/02/21 275 lb 4 oz (124.9 kg)  11/07/20 290 lb (131.5 kg)    HTN: Tolerating all medications without side effects Stable and at goal No CP, no sob. No HA.  BP Readings from Last 3 Encounters:  05/13/22 110/70  04/02/21 104/60  11/07/20 129/63    Basic Metabolic Panel:    Component Value Date/Time   NA 141 05/06/2022 0912   K 4.3 05/06/2022 0912   CL 105 05/06/2022 0912   CO2 27 05/06/2022 0912   BUN 15 05/06/2022 0912   CREATININE 0.85 05/06/2022 0912   GLUCOSE 114 (H) 05/06/2022 0912   CALCIUM 9.8 05/06/2022 0912     Review of Systems is noted in the HPI, as appropriate  Objective:   There were no vitals taken for this visit.  GEN: No acute distress; alert,appropriate. PULM:  Breathing comfortably in no respiratory distress PSYCH: Normally interactive.   Laboratory and Imaging Data:  Assessment and Plan:   ***

## 2023-02-15 ENCOUNTER — Ambulatory Visit: Admitting: Family Medicine

## 2023-02-15 ENCOUNTER — Encounter: Payer: Self-pay | Admitting: Family Medicine

## 2023-02-15 VITALS — BP 90/60 | HR 102 | Temp 98.4°F | Ht 65.0 in | Wt 289.5 lb

## 2023-02-15 DIAGNOSIS — Z7985 Long-term (current) use of injectable non-insulin antidiabetic drugs: Secondary | ICD-10-CM | POA: Diagnosis not present

## 2023-02-15 DIAGNOSIS — I10 Essential (primary) hypertension: Secondary | ICD-10-CM

## 2023-02-15 DIAGNOSIS — E119 Type 2 diabetes mellitus without complications: Secondary | ICD-10-CM

## 2023-02-15 DIAGNOSIS — Z23 Encounter for immunization: Secondary | ICD-10-CM

## 2023-02-15 LAB — POCT GLYCOSYLATED HEMOGLOBIN (HGB A1C): Hemoglobin A1C: 8.8 % — AB (ref 4.0–5.6)

## 2023-02-15 MED ORDER — TRULICITY 1.5 MG/0.5ML ~~LOC~~ SOAJ: 1.5000 mg | SUBCUTANEOUS | 0 refills | Status: DC

## 2023-02-15 MED ORDER — TRULICITY 0.75 MG/0.5ML ~~LOC~~ SOAJ: 0.7500 mg | SUBCUTANEOUS | 0 refills | Status: DC

## 2023-02-15 MED ORDER — TRULICITY 3 MG/0.5ML ~~LOC~~ SOAJ: 3.0000 mg | SUBCUTANEOUS | 1 refills | Status: DC

## 2023-02-15 NOTE — Patient Instructions (Addendum)
Stop Glipizide

## 2023-03-31 ENCOUNTER — Telehealth: Payer: Self-pay | Admitting: *Deleted

## 2023-03-31 DIAGNOSIS — E7800 Pure hypercholesterolemia, unspecified: Secondary | ICD-10-CM

## 2023-03-31 DIAGNOSIS — Z125 Encounter for screening for malignant neoplasm of prostate: Secondary | ICD-10-CM

## 2023-03-31 DIAGNOSIS — E119 Type 2 diabetes mellitus without complications: Secondary | ICD-10-CM

## 2023-03-31 DIAGNOSIS — Z79899 Other long term (current) drug therapy: Secondary | ICD-10-CM

## 2023-03-31 NOTE — Telephone Encounter (Signed)
-----   Message from Lovena Neighbours sent at 03/29/2023  1:46 PM EST ----- Regarding: Labs for Monday 11.18.24 Please put physical lab orders in future. Thank you, Denny Peon

## 2023-04-12 ENCOUNTER — Other Ambulatory Visit (INDEPENDENT_AMBULATORY_CARE_PROVIDER_SITE_OTHER)

## 2023-04-12 DIAGNOSIS — E7800 Pure hypercholesterolemia, unspecified: Secondary | ICD-10-CM

## 2023-04-12 DIAGNOSIS — Z79899 Other long term (current) drug therapy: Secondary | ICD-10-CM | POA: Diagnosis not present

## 2023-04-12 DIAGNOSIS — E119 Type 2 diabetes mellitus without complications: Secondary | ICD-10-CM

## 2023-04-12 DIAGNOSIS — Z125 Encounter for screening for malignant neoplasm of prostate: Secondary | ICD-10-CM

## 2023-04-12 LAB — BASIC METABOLIC PANEL
BUN: 18 mg/dL (ref 6–23)
CO2: 28 meq/L (ref 19–32)
Calcium: 9.7 mg/dL (ref 8.4–10.5)
Chloride: 104 meq/L (ref 96–112)
Creatinine, Ser: 0.97 mg/dL (ref 0.40–1.50)
GFR: 84.34 mL/min (ref >60.00)
Glucose, Bld: 171 mg/dL — ABNORMAL HIGH (ref 70–99)
Potassium: 4.4 meq/L (ref 3.5–5.1)
Sodium: 141 meq/L (ref 135–145)

## 2023-04-12 LAB — HEPATIC FUNCTION PANEL
ALT: 65 U/L — ABNORMAL HIGH (ref 0–53)
AST: 31 U/L (ref 0–37)
Albumin: 4.2 g/dL (ref 3.5–5.2)
Alkaline Phosphatase: 64 U/L (ref 39–117)
Bilirubin, Direct: 0.1 mg/dL (ref 0.0–0.3)
Total Bilirubin: 0.4 mg/dL (ref 0.2–1.2)
Total Protein: 6.9 g/dL (ref 6.0–8.3)

## 2023-04-12 LAB — CBC WITH DIFFERENTIAL/PLATELET
Basophils Absolute: 0.1 10*3/uL (ref 0.0–0.1)
Basophils Relative: 0.8 % (ref 0.0–3.0)
Eosinophils Absolute: 0.3 10*3/uL (ref 0.0–0.7)
Eosinophils Relative: 4 % (ref 0.0–5.0)
HCT: 45.2 % (ref 39.0–52.0)
Hemoglobin: 15.1 g/dL (ref 13.0–17.0)
Lymphocytes Relative: 27.6 % (ref 12.0–46.0)
Lymphs Abs: 1.9 10*3/uL (ref 0.7–4.0)
MCHC: 33.5 g/dL (ref 30.0–36.0)
MCV: 90.2 fL (ref 78.0–100.0)
Monocytes Absolute: 0.7 10*3/uL (ref 0.1–1.0)
Monocytes Relative: 10.7 % (ref 3.0–12.0)
Neutro Abs: 3.9 10*3/uL (ref 1.4–7.7)
Neutrophils Relative %: 56.9 % (ref 43.0–77.0)
Platelets: 232 10*3/uL (ref 150.0–400.0)
RBC: 5.01 Mil/uL (ref 4.22–5.81)
RDW: 14 % (ref 11.5–15.5)
WBC: 6.8 10*3/uL (ref 4.0–10.5)

## 2023-04-12 LAB — LIPID PANEL
Cholesterol: 109 mg/dL (ref 0–200)
HDL: 27.4 mg/dL — ABNORMAL LOW (ref >39.00)
LDL Cholesterol: 46 mg/dL (ref 0–99)
NonHDL: 81.47
Total CHOL/HDL Ratio: 4
Triglycerides: 178 mg/dL — ABNORMAL HIGH (ref 0.0–149.0)
VLDL: 35.6 mg/dL (ref 0.0–40.0)

## 2023-04-12 LAB — HEMOGLOBIN A1C: Hgb A1c MFr Bld: 8.5 % — ABNORMAL HIGH (ref 4.6–6.5)

## 2023-04-13 LAB — MICROALBUMIN / CREATININE URINE RATIO
Creatinine,U: 112.5 mg/dL
Microalb Creat Ratio: 86.8 mg/g — ABNORMAL HIGH (ref 0.0–30.0)
Microalb, Ur: 97.6 mg/dL — ABNORMAL HIGH (ref 0.0–1.9)

## 2023-04-14 ENCOUNTER — Other Ambulatory Visit: Payer: Self-pay | Admitting: Family Medicine

## 2023-04-14 LAB — PSA, TOTAL WITH REFLEX TO PSA, FREE: PSA, Total: 0.6 ng/mL (ref <=4.0)

## 2023-04-18 NOTE — Progress Notes (Unsigned)
Kiara Mcdowell T. Kion Huntsberry, MD, CAQ Sports Medicine [facility] at [facility] [address] [place], [address]  Phone: [phone]  FAX: [phone]  Kevin [name] - 62 y.o. male  MRN [id number]  Date of Birth: [birth year]  Date: 04/19/2023  PCP: Hannah Beat, MD  Referral: Hannah Beat, MD  No chief complaint on file.  Patient Care Team: Hannah Beat, MD as PCP - General Subjective:   Kevin [name] is a 62 y.o. pleasant patient who presents with the following:  Preventative Health Maintenance Visit:  Health Maintenance Summary Reviewed and updated, unless pt declines services.  Tobacco History Reviewed. Alcohol: No concerns, no excessive use Exercise Habits: Some activity, rec at least 30 mins 5 times a week STD concerns: no risk or activity to increase risk Drug Use: None  Follow-up diabetes.  Actually saw him February 15, 2023 for diabetes. At that point I started him on Trulicity and titrated up the dose.  Diabetes Mellitus: Tolerating Medications: yes Compliance with diet: fair, There is no height or weight on file to calculate BMI. Exercise: minimal / intermittent Avg blood sugars at home: not checking Foot problems: none Hypoglycemia: none No nausea, vomitting, blurred vision, polyuria.  Lab Results  Component Value Date   HGBA1C 8.5 (H) 04/12/2023   HGBA1C 8.8 (A) 02/15/2023   HGBA1C 6.5 05/06/2022   Lab Results  Component Value Date   MICROALBUR 97.6 (H) 04/12/2023   LDLCALC 46 04/12/2023   CREATININE 0.97 04/12/2023    Wt Readings from Last 3 Encounters:  02/15/23 289 lb 8 oz (131.3 kg)  05/13/22 287 lb 6 oz (130.4 kg)  04/02/21 275 lb 4 oz (124.9 kg)    Covid booster  Health Maintenance  Topic Date Due   COVID-19 Vaccine (6 - 2023-24 season) 01/24/2023   FOOT EXAM  05/14/2023   OPHTHALMOLOGY EXAM  06/19/2023   HEMOGLOBIN A1C  10/10/2023   Colonoscopy  12/23/2023   Diabetic kidney evaluation -  eGFR measurement  04/11/2024   Diabetic kidney evaluation - Urine ACR  04/11/2024   DTaP/Tdap/Td (5 - Td or Tdap) 02/02/2027   INFLUENZA VACCINE  Completed   Hepatitis C Screening  Completed   HIV Screening  Completed   Zoster Vaccines- Shingrix  Completed   HPV VACCINES  Aged Out   Immunization History  Administered Date(s) Administered   Anthrax 03/19/2006, 05/24/2006, 06/07/2006   Hepatitis A, Adult 03/25/1997, 09/22/1997   Hepatitis B, ADULT 08/31/1997, 10/02/1997, 04/14/2002   IPV 08/03/1984, 11/17/2002   Influenza Nasal 04/10/2005, 04/08/2007   Influenza Split 03/30/2000, 06/09/2001, 03/24/2002, 01/24/2004, 03/15/2006   Influenza, Seasonal, Injecte, Preservative Fre 02/15/2023   Influenza,inj,Quad PF,6+ Mos 01/16/2015, 02/01/2017, 02/09/2018, 03/15/2019, 03/28/2020, 04/02/2021, 05/13/2022   MMR 08/13/1994   Meningococcal polysaccharide vaccine (MPSV4) 01/31/2003   PFIZER(Purple Top)SARS-COV-2 Vaccination 08/23/2019, 09/13/2019, 04/26/2020   Pfizer Covid-19 Vaccine Bivalent Booster 40yrs & up 07/14/2021   Pfizer(Comirnaty)Fall Seasonal Vaccine 12 years and older 05/14/2022   Pneumococcal Polysaccharide-23 03/15/2019   Rabies Immune Globulin 05/24/2006, 06/25/2006   Rabies, intradermal 06/07/2006   Rsv, Bivalent, Protein Subunit Rsvpref,pf Verdis Frederickson) 05/14/2022   Smallpox 03/19/2006   Td 03/25/1997, 10/23/2005, 03/04/2007   Tdap 02/01/2017   Typhoid Inactivated 04/14/2005, 03/04/2007   Typhoid Parenteral 11/17/2002   Varicella 04/14/2005, 07/20/2005   Yellow Fever 08/23/1997, 05/25/2007   Zoster Recombinant(Shingrix) 04/02/2021, 07/29/2021   Patient Active Problem List   Diagnosis Date Noted   Diabetes mellitus without complication (HCC) 04/12/2019    Priority: High  OSA on CPAP 04/27/2016    Priority: Medium    Morbid obesity with BMI of 50.0-59.9, adult (HCC) 08/31/2013    Priority: Medium    HYPERCHOLESTEROLEMIA 12/31/2008    Priority: Medium    Essential  hypertension 12/31/2008    Priority: Medium    Generalized anxiety disorder 10/01/2014   Hypersomnia with sleep apnea 07/16/2014   Tobacco abuse, 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  Family History  Problem Relation Age of Onset   Heart attack Mother    Coronary artery disease Mother    COPD Father    Diabetes Father     Social History   Social History Narrative   Former [employer] '86-2009, E7   Caffeine 4-5 cups daily avg.   Married no kids.    [employer], now [employer]    Past Medical History, Surgical History, Social History, Family History, Problem List, Medications, and Allergies have been reviewed and updated if relevant.  Review of Systems: Pertinent positives are listed above.  Otherwise, a full 14 point review of systems has been done in full and it is negative except where it is noted positive.  Objective:   There were no vitals taken for this visit. Ideal Body Weight:    Ideal Body Weight:   No results found.    05/13/2022    8:26 AM 04/02/2021    8:25 AM 03/28/2020    8:24 AM 04/04/2019    3:24 PM 03/15/2019    8:57 AM  Depression screen PHQ 2/9  Decreased Interest 0 0 0 0 0  Down, Depressed, Hopeless 0 0 0 0 0  PHQ - 2 Score 0 0 0 0 0     GEN: well developed, well nourished, no acute distress Eyes: conjunctiva and lids normal, PERRLA, EOMI ENT: TM clear, nares clear, oral exam WNL Neck: supple, no lymphadenopathy, no thyromegaly, no JVD Pulm: clear to auscultation and percussion, respiratory effort normal CV: regular rate and rhythm, S1-S2, no murmur, rub or gallop, no bruits, peripheral pulses normal and symmetric, no cyanosis, clubbing, edema or varicosities GI: soft, non-tender; no hepatosplenomegaly, masses; active bowel sounds all quadrants GU: deferred Lymph: no cervical, axillary or inguinal adenopathy MSK: gait normal, muscle tone and  strength WNL, no joint swelling, effusions, discoloration, crepitus  SKIN: clear, good turgor, color WNL, no rashes, lesions, or ulcerations Neuro: normal mental status, normal strength, sensation, and motion Psych: alert; oriented to person, place and time, normally interactive and not anxious or depressed in appearance.  All labs reviewed with patient. Results for orders placed or performed in visit on 04/12/23  Hemoglobin A1c  Result Value Ref Range   Hgb A1c MFr Bld 8.5 (H) 4.6 - 6.5 %  PSA, Total with Reflex to PSA, Free  Result Value Ref Range   PSA, Total 0.6 < OR = 4.0 ng/mL  Microalbumin / creatinine urine ratio  Result Value Ref Range   Microalb, Ur 97.6 (H) 0.0 - 1.9 mg/dL   Creatinine,U 147.8 mg/dL   Microalb Creat Ratio 86.8 (H) 0.0 - 30.0 mg/g  Hepatic Function Panel  Result Value Ref Range   Total Bilirubin 0.4 0.2 - 1.2 mg/dL   Bilirubin, Direct 0.1 0.0 - 0.3 mg/dL   Alkaline Phosphatase 64 39 - 117 U/L   AST 31 0 - 37 U/L   ALT 65 (H) 0 -  53 U/L   Total Protein 6.9 6.0 - 8.3 g/dL   Albumin 4.2 3.5 - 5.2 g/dL  Basic metabolic panel  Result Value Ref Range   Sodium 141 135 - 145 mEq/L   Potassium 4.4 3.5 - 5.1 mEq/L   Chloride 104 96 - 112 mEq/L   CO2 28 19 - 32 mEq/L   Glucose, Bld 171 (H) 70 - 99 mg/dL   BUN 18 6 - 23 mg/dL   Creatinine, Ser 4.09 0.40 - 1.50 mg/dL   GFR 81.19 >14.78 mL/min   Calcium 9.7 8.4 - 10.5 mg/dL  CBC with Differential/Platelet  Result Value Ref Range   WBC 6.8 4.0 - 10.5 K/uL   RBC 5.01 4.22 - 5.81 Mil/uL   Hemoglobin 15.1 13.0 - 17.0 g/dL   HCT 29.5 62.1 - 30.8 %   MCV 90.2 78.0 - 100.0 fl   MCHC 33.5 30.0 - 36.0 g/dL   RDW 65.7 84.6 - 96.2 %   Platelets 232.0 150.0 - 400.0 K/uL   Neutrophils Relative % 56.9 43.0 - 77.0 %   Lymphocytes Relative 27.6 12.0 - 46.0 %   Monocytes Relative 10.7 3.0 - 12.0 %   Eosinophils Relative 4.0 0.0 - 5.0 %   Basophils Relative 0.8 0.0 - 3.0 %   Neutro Abs 3.9 1.4 - 7.7 K/uL   Lymphs Abs  1.9 0.7 - 4.0 K/uL   Monocytes Absolute 0.7 0.1 - 1.0 K/uL   Eosinophils Absolute 0.3 0.0 - 0.7 K/uL   Basophils Absolute 0.1 0.0 - 0.1 K/uL  Lipid panel  Result Value Ref Range   Cholesterol 109 0 - 200 mg/dL   Triglycerides 952.8 (H) 0.0 - 149.0 mg/dL   HDL 41.32 (L) >44.01 mg/dL   VLDL 02.7 0.0 - 25.3 mg/dL   LDL Cholesterol 46 0 - 99 mg/dL   Total CHOL/HDL Ratio 4    NonHDL 81.47     Assessment and Plan:     ICD-10-CM   1. Healthcare maintenance  Z00.00       Health Maintenance Exam: The patient's preventative maintenance and recommended screening tests for an annual wellness exam were reviewed in full today. Brought up to date unless services declined.  Counselled on the importance of diet, exercise, and its role in overall health and mortality. The patient's FH and SH was reviewed, including their home life, tobacco status, and drug and alcohol status.  Follow-up in 1 year for physical exam or additional follow-up below.  Disposition: No follow-ups on file.  No orders of the defined types were placed in this encounter.  There are no discontinued medications. No orders of the defined types were placed in this encounter.   Signed,  Elpidio Galea. Zella Dewan, MD   Allergies as of 04/19/2023   No Known Allergies      Medication List        Accurate as of April 18, 2023  6:12 PM. If you have any questions, ask your nurse or doctor.          acetaminophen 500 MG tablet Commonly known as: TYLENOL Take 500 mg by mouth every 6 (six) hours as needed.   amitriptyline 25 MG tablet Commonly known as: ELAVIL TAKE 1 TABLET(25 MG) BY MOUTH AT BEDTIME AS NEEDED FOR SLEEP   aspirin 81 MG tablet Take 81 mg by mouth daily.   escitalopram 10 MG tablet Commonly known as: LEXAPRO TAKE 2 TABLETS AT BEDTIME   glipiZIDE 10 MG 24 hr tablet Commonly known as:  GLUCOTROL XL TAKE 1 TABLET(10 MG) BY MOUTH DAILY WITH BREAKFAST   lisinopril 20 MG tablet Commonly known  as: ZESTRIL TAKE 1 TABLET(20 MG) BY MOUTH DAILY   multivitamin with minerals tablet Take 1 tablet by mouth daily.   rosuvastatin 40 MG tablet Commonly known as: CRESTOR Take 1 tablet (40 mg total) by mouth daily.   tamsulosin 0.4 MG Caps capsule Commonly known as: FLOMAX Take 1 capsule (0.4 mg total) by mouth daily.   Trulicity 0.75 MG/0.5ML Soaj Generic drug: Dulaglutide Inject 0.75 mg into the skin once a week.   Trulicity 1.5 MG/0.5ML Soaj Generic drug: Dulaglutide Inject 1.5 mg into the skin once a week.   Trulicity 3 MG/0.5ML Soaj Generic drug: Dulaglutide Inject 3 mg as directed once a week.   vitamin C 1000 MG tablet Take 1,000 mg by mouth daily.

## 2023-04-19 ENCOUNTER — Encounter: Payer: Self-pay | Admitting: Family Medicine

## 2023-04-19 ENCOUNTER — Ambulatory Visit (INDEPENDENT_AMBULATORY_CARE_PROVIDER_SITE_OTHER): Admitting: Family Medicine

## 2023-04-19 VITALS — BP 120/64 | HR 99 | Temp 98.0°F | Ht 65.0 in | Wt 286.0 lb

## 2023-04-19 DIAGNOSIS — Z0000 Encounter for general adult medical examination without abnormal findings: Secondary | ICD-10-CM | POA: Diagnosis not present

## 2023-05-13 ENCOUNTER — Other Ambulatory Visit: Payer: Self-pay | Admitting: Family Medicine

## 2023-05-17 ENCOUNTER — Ambulatory Visit: Admitting: Family Medicine

## 2023-05-24 ENCOUNTER — Telehealth: Payer: Self-pay | Admitting: Adult Health

## 2023-05-24 DIAGNOSIS — G4733 Obstructive sleep apnea (adult) (pediatric): Secondary | ICD-10-CM

## 2023-05-24 NOTE — Telephone Encounter (Signed)
I called and spoke to [employer] [place] location, pt will need to bring in machine (both machines if he has them) to see what he has and figure out what is needed.  I went ahead a placed order for heated humidity and supplies so can address when in for visit at on site location. I relayed to wife of pt , they will call and see about appt, taking machines and getting it addressed.  She appreciated phone call back.

## 2023-05-24 NOTE — Telephone Encounter (Signed)
Pt's wife asking if a "mist dispenser"  can be ordered for pt's Cpap machine. She isn't sure of the exact name but she said it screws on to the top of the machine. Requesting call back

## 2023-06-03 ENCOUNTER — Telehealth: Payer: Self-pay | Admitting: Family Medicine

## 2023-06-04 ENCOUNTER — Other Ambulatory Visit: Payer: Self-pay | Admitting: Family Medicine

## 2023-06-04 MED ORDER — TRULICITY 3 MG/0.5ML ~~LOC~~ SOAJ: 3.0000 mg | SUBCUTANEOUS | 0 refills | Status: DC

## 2023-06-04 NOTE — Telephone Encounter (Signed)
 LVM for patient to c/b and schedule.

## 2023-06-04 NOTE — Telephone Encounter (Signed)
 Copied from CRM [id number]. Topic: Clinical - Medication Refill >> Jun 03, 2023  3:00 PM Deidre DASEN wrote: Most Recent Primary Care Visit:  Provider: WATT MIRZA  Department: [facility]  Visit Type: PHYSICAL  Date: 04/19/2023  Medication: Dulaglutide  (TRULICITY ) 3 MG/0.5ML SOPN  Has the patient contacted their pharmacy? Yes (Agent: If no, request that the patient contact the pharmacy for the refill. If patient does not wish to contact the pharmacy document the reason why and proceed with request.) (Agent: If yes, when and what did the pharmacy advise?)  Is this the correct pharmacy for this prescription? yes If no, delete pharmacy and type the correct one.  This is the patient's preferred pharmacy:    [id number] [place], [place] - [address] AT [facility] [address] [place] [address] Phone: [phone] Fax: [phone]   Has the prescription been filled recently? No  Is the patient out of the medication? Yes  Has the patient been seen for an appointment in the last year OR does the patient have an upcoming appointment? Yes  Can we respond through MyChart? No  Agent: Please be advised that Rx refills may take up to 3 business days. We ask that you follow-up with your pharmacy.

## 2023-06-04 NOTE — Telephone Encounter (Signed)
 Please schedule:  Return (around 07/20/2023) for diabetes.

## 2023-06-17 ENCOUNTER — Other Ambulatory Visit: Payer: Self-pay | Admitting: Family Medicine

## 2023-06-21 ENCOUNTER — Other Ambulatory Visit: Payer: Self-pay | Admitting: Family Medicine

## 2023-06-21 MED ORDER — ROSUVASTATIN CALCIUM 40 MG PO TABS: 40.0000 mg | ORAL_TABLET | Freq: Every day | ORAL | 3 refills | Status: DC

## 2023-07-21 ENCOUNTER — Ambulatory Visit: Admitting: Family Medicine

## 2023-07-21 ENCOUNTER — Other Ambulatory Visit: Payer: Self-pay | Admitting: Family Medicine

## 2023-07-27 NOTE — Progress Notes (Unsigned)
   Zully Frane T. Panagiotis Oelkers, MD, CAQ Sports Medicine [facility] at [facility] [address] [place], [address]  Phone: [phone]  FAX: [phone]  Kevin [name] - 63 y.o. male  MRN [id number]  Date of Birth: 1960-08-02  Date: 07/28/2023  PCP: Hannah Beat, MD  Referral: Hannah Beat, MD  No chief complaint on file.  Subjective:   Kevin [name] is a 63 y.o. very pleasant male patient with There is no height or weight on file to calculate BMI. who presents with the following:  Kevin [name] is a very nice patient who I recall quite well.  He presents today for follow-up on diabetes.  Last time I saw him, we were planning on titrating up his Trulicity dose to the maximum.  Diabetes Mellitus: Tolerating Medications: yes Compliance with diet: fair, There is no height or weight on file to calculate BMI. Exercise: minimal / intermittent Avg blood sugars at home: not checking Foot problems: none Hypoglycemia: none No nausea, vomitting, blurred vision, polyuria.  Lab Results  Component Value Date   HGBA1C 8.5 (H) 04/12/2023   HGBA1C 8.8 (A) 02/15/2023   HGBA1C 6.5 05/06/2022   Lab Results  Component Value Date   MICROALBUR 97.6 (H) 04/12/2023   LDLCALC 46 04/12/2023   CREATININE 0.97 04/12/2023    Wt Readings from Last 3 Encounters:  04/19/23 286 lb (129.7 kg)  02/15/23 289 lb 8 oz (131.3 kg)  05/13/22 287 lb 6 oz (130.4 kg)    HTN: Tolerating all medications without side effects Stable and at goal No CP, no sob. No HA.  BP Readings from Last 3 Encounters:  04/19/23 120/64  02/15/23 90/60  05/13/22 110/70    Basic Metabolic Panel:    Component Value Date/Time   NA 141 04/12/2023 0824   K 4.4 04/12/2023 0824   CL 104 04/12/2023 0824   CO2 28 04/12/2023 0824   BUN 18 04/12/2023 0824   CREATININE 0.97 04/12/2023 0824   GLUCOSE 171 (H) 04/12/2023 0824   CALCIUM 9.7 04/12/2023 0824    There is no height or weight on file to  calculate BMI.   Review of Systems is noted in the HPI, as appropriate  Objective:   There were no vitals taken for this visit.  GEN: No acute distress; alert,appropriate. PULM: Breathing comfortably in no respiratory distress PSYCH: Normally interactive.   Laboratory and Imaging Data:  Assessment and Plan:   ***

## 2023-07-28 ENCOUNTER — Encounter: Payer: Self-pay | Admitting: Family Medicine

## 2023-07-28 ENCOUNTER — Ambulatory Visit: Admitting: Family Medicine

## 2023-07-28 VITALS — BP 100/70 | HR 92 | Temp 98.3°F | Ht 65.0 in | Wt 283.5 lb

## 2023-07-28 DIAGNOSIS — Z6841 Body Mass Index (BMI) 40.0 and over, adult: Secondary | ICD-10-CM

## 2023-07-28 DIAGNOSIS — Z7985 Long-term (current) use of injectable non-insulin antidiabetic drugs: Secondary | ICD-10-CM

## 2023-07-28 DIAGNOSIS — F411 Generalized anxiety disorder: Secondary | ICD-10-CM

## 2023-07-28 DIAGNOSIS — E119 Type 2 diabetes mellitus without complications: Secondary | ICD-10-CM | POA: Diagnosis not present

## 2023-07-28 DIAGNOSIS — F330 Major depressive disorder, recurrent, mild: Secondary | ICD-10-CM

## 2023-07-28 DIAGNOSIS — I10 Essential (primary) hypertension: Secondary | ICD-10-CM

## 2023-07-28 HISTORY — DX: Major depressive disorder, recurrent, mild: F33.0

## 2023-07-28 LAB — POCT GLYCOSYLATED HEMOGLOBIN (HGB A1C): Hemoglobin A1C: 8.5 % — AB (ref 4.0–5.6)

## 2023-07-28 MED ORDER — TRULICITY 4.5 MG/0.5ML ~~LOC~~ SOAJ: 4.5000 mg | SUBCUTANEOUS | 3 refills | Status: DC

## 2023-07-28 MED ORDER — BUSPIRONE HCL 5 MG PO TABS: 5.0000 mg | ORAL_TABLET | Freq: Two times a day (BID) | ORAL | 3 refills | Status: DC

## 2023-08-19 ENCOUNTER — Ambulatory Visit: Payer: Self-pay | Admitting: Family Medicine

## 2023-08-19 ENCOUNTER — Ambulatory Visit: Payer: Self-pay

## 2023-08-19 MED ORDER — MIRTAZAPINE 15 MG PO TABS: 15.0000 mg | ORAL_TABLET | Freq: Every day | ORAL | 2 refills | Status: DC

## 2023-08-19 NOTE — Telephone Encounter (Signed)
 1st attempt, left voicemail to return call.  Copied from CRM [id number]. Topic: Clinical - Medical Advice >> Aug 19, 2023 12:12 PM Isabelle Course C wrote: Reason for CRM: Patient has been taking busPIRone (BUSPAR) 5 MG tablet and is having side effects. Please contact patient back regarding this matter.

## 2023-08-19 NOTE — Addendum Note (Signed)
 Addended by: Hannah Beat on: 08/19/2023 03:59 PM   Modules accepted: Orders

## 2023-08-19 NOTE — Telephone Encounter (Signed)
 Patient has been scheduled to see Dr. Ermalene Searing tomorrow 08/20/23 at 11:00 am.

## 2023-08-19 NOTE — Telephone Encounter (Signed)
 I just spoke to the patient myself on the phone.  I am going to have him stop BuSpar.  We will start some Remeron at bedtime.  He can cancel his appointment tomorrow with Dr. Ermalene Searing.  I am not sure how to do that.  If you can assist I would appreciate it.  He also needs to have a follow-up with me in about 4 weeks.

## 2023-08-19 NOTE — Telephone Encounter (Signed)
  Chief Complaint: headache  Symptoms: angry/anxious/depression   Disposition: [] ED /[] Urgent Care (no appt availability in office) / [x] Appointment(In office/virtual)/ []  [facility]/ [] Home Care/ [] Refused Recommended Disposition /[] [facility]/ []  Follow-up with PCP Additional Notes: Wife Debbie call with concerns of headache, anxiety/depression worsening.  Pt was seen on 3/5 and was started on Buspar. Since starting, headaches started and pt has been very quick to anger. Eunice Blase said this is very unlike him. Anxiety has been a concern, but Eunice Blase feels it is getting worse. Eunice Blase feels this worsening is related to starting Buspar. Pt has appt 3/28 @ 11. RN gave care advice and pt verbalized understanding.        Copied from CRM [id number]. Topic: General - Other >> Aug 19, 2023  1:19 PM Aisha D wrote: Reason for CRM: Patient is calling back to speak with the triage nurse. Patient stated she missed her call and was informed to call back and ask for the triage nurse. Reason for Disposition  [1] MODERATE headache (e.g., interferes with normal activities) AND [2] present > 24 hours AND [3] unexplained  (Exceptions: analgesics not tried, typical migraine, or headache part of viral illness)  Answer Assessment - Initial Assessment Questions 1. LOCATION: "Where does it hurt?"      tem[place] 2. ONSET: "When did the headache start?" (Minutes, hours or days)      2 weeks ago  3. PATTERN: "Does the pain come and go, or has it been constant since it started?"     Comes and go  4. SEVERITY: "How bad is the pain?" and "What does it keep you from doing?"  (e.g., Scale 1-10; mild, moderate, or severe)   - MILD (1-3): doesn't interfere with normal activities    - MODERATE (4-7): interferes with normal activities or awakens from sleep    - SEVERE (8-10): excruciating pain, unable to do any normal activities        Moderate  5. RECURRENT SYMPTOM: "Have you ever had headaches before?"  If Yes, ask: "When was the last time?" and "What happened that time?"      Yes sinus related  6. CAUSE: "What do you think is causing the headache?"     Starting Busbar   9. OTHER SYMPTOMS: "Do you have any other symptoms?" (fever, stiff neck, eye pain, sore throat, cold symptoms)     Personality changes  Protocols used: Headache-A-AH

## 2023-08-19 NOTE — Telephone Encounter (Signed)
 Wife Debbie call with concerns of headache, anxiety/depression worsening.  Pt was seen on 3/5 and was started on Buspar. Since starting, headaches started and pt has been very quick to anger. Eunice Blase said this is very unlike him. Anxiety has been a concern, but Eunice Blase feels it is getting worse. Eunice Blase feels this worsening is related to starting Buspar. Pt has appt 3/28 @ 11. RN gave care advice and pt verbalized understanding.

## 2023-08-19 NOTE — Telephone Encounter (Signed)
 Patient called, left VM to return the call to the office to speak to NT.

## 2023-08-19 NOTE — Telephone Encounter (Signed)
 Unable to reach patient after 3 attempts by E2C2 NT, routing to the provider for resolution per protocol.

## 2023-08-19 NOTE — Telephone Encounter (Signed)
 Appointment with Dr. Jennefer Bravo cancelled on 08/20/23 at instructed by Dr. Patsy Lager.  I called and spoke with Mrs. Sundra Aland and scheduled 4 week follow up with Dr. Patsy Lager  on 09/20/23 at 12:00 pm.

## 2023-08-20 ENCOUNTER — Ambulatory Visit: Admitting: Family Medicine

## 2023-08-23 ENCOUNTER — Ambulatory Visit
Admission: RE | Admit: 2023-08-23 | Discharge: 2023-08-23 | Disposition: A | Discharge status: Home / Self Care | Source: Ambulatory Visit | Attending: Family Medicine | Admitting: Family Medicine

## 2023-08-23 ENCOUNTER — Encounter: Payer: Self-pay | Admitting: Family Medicine

## 2023-08-23 ENCOUNTER — Ambulatory Visit: Admitting: Family Medicine

## 2023-08-23 VITALS — BP 100/60 | HR 92 | Temp 98.0°F | Ht 65.0 in | Wt 285.0 lb

## 2023-08-23 DIAGNOSIS — R1900 Intra-abdominal and pelvic swelling, mass and lump, unspecified site: Secondary | ICD-10-CM

## 2023-08-23 DIAGNOSIS — K828 Other specified diseases of gallbladder: Secondary | ICD-10-CM

## 2023-08-23 DIAGNOSIS — R1084 Generalized abdominal pain: Secondary | ICD-10-CM | POA: Diagnosis present

## 2023-08-23 LAB — HEPATIC FUNCTION PANEL
ALT: 34 U/L (ref 0–53)
AST: 19 U/L (ref 0–37)
Albumin: 3.9 g/dL (ref 3.5–5.2)
Alkaline Phosphatase: 66 U/L (ref 39–117)
Bilirubin, Direct: 0.1 mg/dL (ref 0.0–0.3)
Total Bilirubin: 0.6 mg/dL (ref 0.2–1.2)
Total Protein: 6.8 g/dL (ref 6.0–8.3)

## 2023-08-23 LAB — CBC WITH DIFFERENTIAL/PLATELET
Basophils Absolute: 0 10*3/uL (ref 0.0–0.1)
Basophils Relative: 0.3 % (ref 0.0–3.0)
Eosinophils Absolute: 0.3 10*3/uL (ref 0.0–0.7)
Eosinophils Relative: 3.7 % (ref 0.0–5.0)
HCT: 44.8 % (ref 39.0–52.0)
Hemoglobin: 15.2 g/dL (ref 13.0–17.0)
Lymphocytes Relative: 26.9 % (ref 12.0–46.0)
Lymphs Abs: 1.9 10*3/uL (ref 0.7–4.0)
MCHC: 33.9 g/dL (ref 30.0–36.0)
MCV: 89.2 fl (ref 78.0–100.0)
Monocytes Absolute: 0.8 10*3/uL (ref 0.1–1.0)
Monocytes Relative: 10.8 % (ref 3.0–12.0)
Neutro Abs: 4.1 10*3/uL (ref 1.4–7.7)
Neutrophils Relative %: 58.3 % (ref 43.0–77.0)
Platelets: 214 10*3/uL (ref 150.0–400.0)
RBC: 5.02 Mil/uL (ref 4.22–5.81)
RDW: 13.7 % (ref 11.5–15.5)
WBC: 7.1 10*3/uL (ref 4.0–10.5)

## 2023-08-23 LAB — BASIC METABOLIC PANEL WITH GFR
BUN: 13 mg/dL (ref 6–23)
CO2: 26 meq/L (ref 19–32)
Calcium: 9.1 mg/dL (ref 8.4–10.5)
Chloride: 103 meq/L (ref 96–112)
Creatinine, Ser: 1 mg/dL (ref 0.40–1.50)
GFR: 81.11 mL/min (ref >60.00)
Glucose, Bld: 223 mg/dL — ABNORMAL HIGH (ref 70–99)
Potassium: 4.2 meq/L (ref 3.5–5.1)
Sodium: 137 meq/L (ref 135–145)

## 2023-08-23 LAB — LIPASE: Lipase: 21 U/L (ref 11.0–59.0)

## 2023-08-23 LAB — TSH: TSH: 1.57 u[IU]/mL (ref 0.35–5.50)

## 2023-08-23 MED ORDER — MIRTAZAPINE 15 MG PO TABS: 15.0000 mg | ORAL_TABLET | Freq: Every day | ORAL | 2 refills | Status: DC

## 2023-08-23 MED ORDER — IOHEXOL 350 MG/ML SOLN
100.0000 mL | Freq: Once | INTRAVENOUS | Status: AC | PRN
  Administered 2023-08-23: 100 mL via INTRAVENOUS

## 2023-08-23 MED ORDER — IOHEXOL 300 MG/ML  SOLN
30.0000 mL | Freq: Once | INTRAMUSCULAR | Status: AC | PRN
Start: 2023-08-23 — End: 2023-08-23
  Administered 2023-08-23: 30 mL via ORAL

## 2023-08-23 NOTE — Progress Notes (Addendum)
 Kevin [name] T. Dedric Ethington, MD, CAQ Sports Medicine [facility] [address] [place], [address]  Phone: [phone]  FAX: [phone]  [name] - 63 y.o. male  MRN [id number]  Date of Birth: June 19, 1960  Date: 08/23/2023  PCP: Hannah Beat, MD  Referral: Hannah Beat, MD  Chief Complaint  Patient presents with   Abdominal Pain    X 5 days   Bloated   Gas   Subjective:   Kevin [name] is a 63 y.o. very pleasant male patient with Body mass index is 47.43 kg/m. who presents with the following:  Acute abdominal pain x 5 days.  He is a very pleasant patient who I have known well for many years.  He is currently having acute abdominal pain that is relatively generalized, with some more relative pain in the right upper quadrant, epigastric, and left upper quadrant.  He does have global pain relatively diffusely.  He has no history of intra-abdominal or intrapelvic surgery.  He does note that he last had a bowel movement about 4 days ago.  He is generally a very stoic gentleman, and right now he is in some severe pain.  He has been passing flatus.  He has been urinating normally. He has not eaten in 2 days.  Urinating normally Passing gas Really bad breath  Alka selzer, tums, gas ex, dulcolax Last colonoscopy 2015  Review of Systems is noted in the HPI, as appropriate  Patient Active Problem List   Diagnosis Date Noted   Diabetes mellitus without complication (HCC) 04/12/2019    Priority: High   OSA on CPAP 04/27/2016    Priority: Medium    Morbid obesity with BMI of 50.0-59.9, adult (HCC) 08/31/2013    Priority: Medium    HYPERCHOLESTEROLEMIA 12/31/2008    Priority: Medium    Essential hypertension 12/31/2008    Priority: Medium    Mild episode of recurrent major depressive disorder (HCC) 07/28/2023   Generalized anxiety disorder 10/01/2014   Hypersomnia with sleep apnea 07/16/2014    Past Medical  History:  Diagnosis Date   Diabetes mellitus without complication (HCC) 04/12/2019   Hyperlipidemia    Hypertension    Obesity     History reviewed. No pertinent surgical history.  Family History  Problem Relation Age of Onset   Heart attack Mother    Coronary artery disease Mother    COPD Father    Diabetes Father     Social History   Social History Narrative   [employer]-2009, [employer]   Caffeine 4-5 cups daily avg.   Married no kids.    [place], now [place]     Objective:   BP 100/60 (BP Location: Right Arm, Patient Position: Sitting, Cuff Size: Large)   Pulse 92   Temp 98 F (36.7 C) (Temporal)   Ht 5\' 5"  (1.651 m)   Wt 285 lb (129.3 kg)   SpO2 95%   BMI 47.43 kg/m   GEN: No acute distress; alert,appropriate. CV: RRR, no m/g/r  PULM: Normal respiratory rate, no accessory muscle use. No wheezes, crackles or rhonchi  Abdominal exam: He has some mild distention.  He has tenderness relatively diffusely, there is somewhat more tenderness in the right upper quadrant, epigastric region, left upper quadrant.  He has some mild rebound tenderness.  No fluid wave.  Bowel sounds are positive. PSYCH: Normally interactive.   Laboratory and Imaging Data:  Assessment and Plan:     ICD-10-CM  1. Generalized abdominal pain  R10.84 Basic metabolic panel with GFR    CBC with Differential/Platelet    Hepatic function panel    TSH    Lipase    H. pylori breath test    CT ABDOMEN PELVIS W CONTRAST    US ABDOMEN LIMITED RUQ (LIVER/GB)    MR Abdomen W Wo Contrast    2. Gallbladder mass  K82.8 US ABDOMEN LIMITED RUQ (LIVER/GB)    MR Abdomen W Wo Contrast    3. Intra-abdominal and pelvic swelling, mass and lump, unspecified site  R19.00 MR Abdomen W Wo Contrast     Patient is having new and severe abdominal pain for the last 5 days.  Think that this needs to be fully explored to evaluate for acute emergent processes.  We will check basic labs to ensure that his liver function  and kidney function are normal.  We will check a lipase and additionally an H. pylori breath test.  Obtain a CT of the abdomen and pelvis with oral contrast and with IV contrast to evaluate for potential appendicitis, cholecystitis, necrotic pancreas, or intra-abdominal neoplasm.  Cannot exclude diverticulitis.  His testing will help direct additional care.  Addendum, 08/31/2023, 13:00 PM The patient's ultrasound has come back and it was inconclusive.  The patient has had both a CT abdomen pelvis with contrast as well as an abdominal ultrasound to evaluate the patient's gallbladder mass, and all results have been inconclusive.  Radiology recommended MRI of the abdomen to characterize this area.  Obtain an MRI of the abdomen with and without contrast to evaluate gallbladder mass and potential neoplasm.  CLINICAL DATA:  Gallbladder mass   EXAM: ULTRASOUND ABDOMEN LIMITED RIGHT UPPER QUADRANT   COMPARISON:  08/23/2023 CT scan   FINDINGS: Gallbladder:   Gallbladder is mildly distended. Evaluation somewhat limited by overlapping bowel gas and soft tissue. No shadowing stones. Focal nodular areas suggested at the fundus corresponding the finding by CT measuring 12 mm. This is poorly defined by ultrasound.   Common bile duct:   Diameter: 3 mm   Liver:   Echogenic hepatic parenchyma consistent with fatty liver infiltration. Please correlate with prior contrast CT. Portal vein is patent on color Doppler imaging with normal direction of blood flow towards the liver.   Other: None.   IMPRESSION: Fatty liver infiltration.  No gallstones or ductal dilatation.   The nodular areas seen in the fundus of the gallbladder is poorly defined on this ultrasound with overlapping bowel gas and soft tissue. Based on imaging this could be an area of adenomyomatosis but could be confirmed as such with MRI when clinically appropriate.     Electronically Signed   By: Karen Kays M.D.   On:  08/24/2023 16:23    Addendum: 08/31/23 1:26 PM  At this point, all of his testing has resulted including all labs which are essentially normal.  He also has a CT abdomen and pelvis which is reassuring.  There is no appendicitis, and there is no obvious diverticulitis or other cause for acute abdominal pain.  At this point I do not have a clear source for his abdominal pain.  He did have a bowel movement yesterday, think this helps on his symptoms.  There is a questionable gallbladder mass at the fundus of the gallbladder.  Radiology recommends follow-up ultrasound to evaluate for potential mass and neoplasm.  CT ABDOMEN PELVIS W CONTRAST Result Date: 08/23/2023 CLINICAL DATA:  Acute abdominal pain. EXAM: CT ABDOMEN AND PELVIS  WITH CONTRAST TECHNIQUE: Multidetector CT imaging of the abdomen and pelvis was performed using the standard protocol following bolus administration of intravenous contrast. RADIATION DOSE REDUCTION: This exam was performed according to the departmental dose-optimization program which includes automated exposure control, adjustment of the mA and/or kV according to patient size and/or use of iterative reconstruction technique. CONTRAST:  OMNIPAQUE IOHEXOL 350 MG/ML SOLN, 30mL OMNIPAQUE IOHEXOL 300 MG/ML SOLN COMPARISON:  None Available. FINDINGS: Lower chest: No acute abnormality. Hepatobiliary: The liver is enlarged. There is diffuse fatty infiltration. Ductal dilatation. No calcified gallstones are present. Small rounded exophytic density from the fundus of the gallbladder may represent Phrygian cap, but small mass not excluded. Gallbladder and bile ducts are within normal limits. There is no biliary Pancreas: There are few punctate calcifications in the pancreas likely related to chronic pancreatitis. No acute inflammation, fluid collection or ductal dilatation. Spleen: Normal in size without focal abnormality. Adrenals/Urinary Tract: There are rounded hypodensities in the  left kidney measuring up to 2.1 cm compatible with cysts. Otherwise, the kidneys, adrenal glands, and bladder are within normal limits. Stomach/Bowel: Stomach is within normal limits. Appendix appears normal. No evidence of bowel wall thickening, distention, or inflammatory changes. Vascular/Lymphatic: No significant vascular findings are present. No enlarged abdominal or pelvic lymph nodes. Reproductive: Prostate is unremarkable. Other: No abdominal wall hernia or abnormality. No abdominopelvic ascites. Musculoskeletal: Degenerative changes affect the spine. IMPRESSION: 1. No acute localizing process in the abdomen or pelvis. 2. Hepatomegaly with fatty infiltration of the liver. 3. Small rounded exophytic density from the fundus of the gallbladder may represent Phrygian cap, but small mass not excluded. Recommend further evaluation with ultrasound. 4. Left renal cysts. No follow-up imaging recommended. 5. Findings compatible with chronic pancreatitis. Electronically Signed   By: Darliss Cheney M.D.   On: 08/23/2023 16:22     Medication Management during today's office visit: Meds ordered this encounter  Medications   mirtazapine (REMERON) 15 MG tablet    Sig: Take 1 tablet (15 mg total) by mouth at bedtime.    Dispense:  30 tablet    Refill:  2   Medications Discontinued During This Encounter  Medication Reason   escitalopram (LEXAPRO) 10 MG tablet Completed Course   mirtazapine (REMERON) 15 MG tablet Reorder    Orders placed today for conditions managed today: Orders Placed This Encounter  Procedures   CT ABDOMEN PELVIS W CONTRAST   US ABDOMEN LIMITED RUQ (LIVER/GB)   MR Abdomen W Wo Contrast   Basic metabolic panel with GFR   CBC with Differential/Platelet   Hepatic function panel   TSH   Lipase   H. pylori breath test    Disposition: No follow-ups on file.  Dr[employer] One speech-to-text software was used for transcription in this dictation.  Possible transcriptional errors can  occur using Animal nutritionist.   Signed,  Elpidio Galea. Jessica Seidman, MD   Outpatient Encounter Medications as of 08/23/2023  Medication Sig   acetaminophen (TYLENOL) 500 MG tablet Take 500 mg by mouth every 6 (six) hours as needed.   amitriptyline (ELAVIL) 25 MG tablet TAKE 1 TABLET(25 MG) BY MOUTH AT BEDTIME AS NEEDED FOR SLEEP   Ascorbic Acid (VITAMIN C) 1000 MG tablet Take 1,000 mg by mouth daily.   aspirin 81 MG tablet Take 81 mg by mouth daily.     Dulaglutide (TRULICITY) 4.5 MG/0.5ML SOAJ Inject 4.5 mg into the skin once a week.   lisinopril (ZESTRIL) 20 MG tablet TAKE 1 TABLET(20 MG) BY  MOUTH DAILY   mirtazapine (REMERON) 15 MG tablet Take 1 tablet (15 mg total) by mouth at bedtime.   Multiple Vitamins-Minerals (MULTIVITAMIN WITH MINERALS) tablet Take 1 tablet by mouth daily.   rosuvastatin (CRESTOR) 40 MG tablet Take 1 tablet (40 mg total) by mouth daily.   tamsulosin (FLOMAX) 0.4 MG CAPS capsule TAKE 1 CAPSULE(0.4 MG) BY MOUTH DAILY   [DISCONTINUED] escitalopram (LEXAPRO) 10 MG tablet TAKE 2 TABLETS BY MOUTH AT BEDTIME   [DISCONTINUED] mirtazapine (REMERON) 15 MG tablet Take 1 tablet (15 mg total) by mouth at bedtime. (Patient not taking: Reported on 08/23/2023)   No facility-administered encounter medications on file as of 08/23/2023.

## 2023-08-24 ENCOUNTER — Ambulatory Visit
Admission: RE | Admit: 2023-08-24 | Discharge: 2023-08-24 | Disposition: A | Discharge status: Home / Self Care | Source: Ambulatory Visit | Attending: Family Medicine | Admitting: Family Medicine

## 2023-08-24 DIAGNOSIS — K828 Other specified diseases of gallbladder: Secondary | ICD-10-CM | POA: Diagnosis present

## 2023-08-24 DIAGNOSIS — R1084 Generalized abdominal pain: Secondary | ICD-10-CM | POA: Diagnosis present

## 2023-08-24 LAB — H. PYLORI BREATH TEST: H. pylori Breath Test: NOT DETECTED

## 2023-08-24 NOTE — Addendum Note (Signed)
 Addended by: Hannah Beat on: 08/24/2023 09:10 AM   Modules accepted: Orders

## 2023-08-25 ENCOUNTER — Other Ambulatory Visit: Payer: Self-pay | Admitting: Family Medicine

## 2023-08-26 ENCOUNTER — Encounter: Payer: Self-pay | Admitting: Family Medicine

## 2023-08-31 ENCOUNTER — Telehealth: Payer: Self-pay | Admitting: *Deleted

## 2023-08-31 NOTE — Telephone Encounter (Signed)
 What referral is he requesting an update on?

## 2023-08-31 NOTE — Telephone Encounter (Signed)
 Copied from CRM [id number]. Topic: General - Other >> Aug 31, 2023  1:22 PM Rodman Pickle T wrote: Reason for CRM: patient is needing a update on his referral

## 2023-08-31 NOTE — Telephone Encounter (Signed)
 That's all I was seeing as well.   I will contact him once I process his MRI and let him know where to call to schedule.

## 2023-08-31 NOTE — Addendum Note (Signed)
 Addended by: Hannah Beat on: 08/31/2023 01:26 PM   Modules accepted: Orders

## 2023-08-31 NOTE — Telephone Encounter (Signed)
 Only thing I see is a MRI that Dr. Patsy Lager just ordered today.

## 2023-09-13 ENCOUNTER — Ambulatory Visit
Admission: RE | Admit: 2023-09-13 | Discharge: 2023-09-13 | Disposition: A | Discharge status: Home / Self Care | Source: Ambulatory Visit | Attending: Family Medicine | Admitting: Family Medicine

## 2023-09-13 DIAGNOSIS — R1900 Intra-abdominal and pelvic swelling, mass and lump, unspecified site: Secondary | ICD-10-CM | POA: Insufficient documentation

## 2023-09-13 DIAGNOSIS — K828 Other specified diseases of gallbladder: Secondary | ICD-10-CM | POA: Insufficient documentation

## 2023-09-13 DIAGNOSIS — R1084 Generalized abdominal pain: Secondary | ICD-10-CM | POA: Diagnosis present

## 2023-09-13 MED ORDER — GADOBUTROL 1 MMOL/ML IV SOLN
10.0000 mL | Freq: Once | INTRAVENOUS | Status: AC | PRN
Start: 2023-09-13 — End: 2023-09-13
  Administered 2023-09-13: 10 mL via INTRAVENOUS

## 2023-09-19 NOTE — Progress Notes (Unsigned)
     Carsen Machi T. Adryan Druckenmiller, MD, CAQ Sports Medicine [facility] at [facility] [address] [place], [address]  Phone: [phone]  FAX: [phone]  Kevin [name] - 63 y.o. male  MRN [id number]  Date of Birth: Oct 03, 1960  Date: 09/20/2023  PCP: Scherrie Curt, MD  Referral: Scherrie Curt, MD  No chief complaint on file.  Subjective:   Kevin [name] is a 63 y.o. very pleasant male patient with There is no height or weight on file to calculate BMI. who presents with the following:  He is a very well-known patient.  He is here to follow-up about his mood.  He is currently taking Remeron  15 mg at nighttime.  He is also taking amitriptyline  25 mg at nighttime.  I did initially try him on buspirone  5 mg twice a day, but he was not really able to tolerate this.    Review of Systems is noted in the HPI, as appropriate  Objective:   There were no vitals taken for this visit.  GEN: No acute distress; alert,appropriate. PULM: Breathing comfortably in no respiratory distress PSYCH: Normally interactive.   Laboratory and Imaging Data:  Assessment and Plan:   ***

## 2023-09-20 ENCOUNTER — Encounter: Payer: Self-pay | Admitting: Family Medicine

## 2023-09-20 ENCOUNTER — Ambulatory Visit: Admitting: Family Medicine

## 2023-09-20 VITALS — BP 90/66 | HR 99 | Temp 97.7°F | Ht 65.0 in | Wt 277.4 lb

## 2023-09-20 DIAGNOSIS — F330 Major depressive disorder, recurrent, mild: Secondary | ICD-10-CM

## 2023-09-20 DIAGNOSIS — Z7985 Long-term (current) use of injectable non-insulin antidiabetic drugs: Secondary | ICD-10-CM

## 2023-09-20 DIAGNOSIS — F411 Generalized anxiety disorder: Secondary | ICD-10-CM | POA: Diagnosis not present

## 2023-09-20 DIAGNOSIS — E119 Type 2 diabetes mellitus without complications: Secondary | ICD-10-CM | POA: Diagnosis not present

## 2023-09-20 DIAGNOSIS — Z7984 Long term (current) use of oral hypoglycemic drugs: Secondary | ICD-10-CM

## 2023-09-20 MED ORDER — METFORMIN HCL ER 500 MG PO TB24: 500.0000 mg | ORAL_TABLET | Freq: Every day | ORAL | 1 refills | Status: DC

## 2023-09-23 ENCOUNTER — Encounter: Payer: Self-pay | Admitting: Family Medicine

## 2023-10-08 ENCOUNTER — Other Ambulatory Visit: Payer: Self-pay | Admitting: Family Medicine

## 2023-11-17 LAB — HM DIABETES EYE EXAM

## 2023-11-22 ENCOUNTER — Other Ambulatory Visit: Payer: Self-pay | Admitting: Family Medicine

## 2023-12-26 NOTE — Progress Notes (Unsigned)
     Kevin Ozbun T. [name], MD, CAQ Sports Medicine [facility] at [facility] [address] [place], [address]  Phone: [phone]  FAX: [phone]  Kevin [name] - 63 y.o. male  MRN [id number]  Date of Birth: 01-Jan-1961  Date: 12/27/2023  PCP: Watt Mirza, MD  Referral: Watt Mirza, MD  No chief complaint on file.  Subjective:   Kevin [name] is a 63 y.o. very pleasant male patient with There is no height or weight on file to calculate BMI. who presents with the following:  Patient is here for follow-up diabetes.  Last time I saw him, his blood sugars were still elevated despite being on Trulicity  4.5 mg.  At that point I restarted him on metformin  at 500 mg a day. His last A1c was 8.5    He is here today to follow-up regarding predominantly his blood sugar.  Additionally, last time I saw him his anxiety had been improving.  Lab Results  Component Value Date   HGBA1C 8.5 (A) 07/28/2023     Review of Systems is noted in the HPI, as appropriate  Objective:   There were no vitals taken for this visit.  GEN: No acute distress; alert,appropriate. PULM: Breathing comfortably in no respiratory distress PSYCH: Normally interactive.   Laboratory and Imaging Data:  Assessment and Plan:   ***

## 2023-12-27 ENCOUNTER — Encounter: Payer: Self-pay | Admitting: Family Medicine

## 2023-12-27 ENCOUNTER — Ambulatory Visit: Admitting: Family Medicine

## 2023-12-27 VITALS — BP 120/66 | HR 97 | Temp 97.7°F | Ht 65.0 in | Wt 269.5 lb

## 2023-12-27 DIAGNOSIS — Z7984 Long term (current) use of oral hypoglycemic drugs: Secondary | ICD-10-CM

## 2023-12-27 DIAGNOSIS — E119 Type 2 diabetes mellitus without complications: Secondary | ICD-10-CM

## 2023-12-27 LAB — POCT GLYCOSYLATED HEMOGLOBIN (HGB A1C): Hemoglobin A1C: 7.3 % — AB (ref 4.0–5.6)

## 2023-12-27 LAB — MICROALBUMIN / CREATININE URINE RATIO
Creatinine,U: 85.3 mg/dL
Microalb Creat Ratio: 508.3 mg/g — ABNORMAL HIGH (ref 0.0–30.0)
Microalb, Ur: 43.4 mg/dL — ABNORMAL HIGH (ref 0.0–1.9)

## 2023-12-27 MED ORDER — METFORMIN HCL ER 500 MG PO TB24: 1000.0000 mg | ORAL_TABLET | Freq: Every day | ORAL | 1 refills | Status: DC

## 2023-12-28 ENCOUNTER — Ambulatory Visit: Payer: Self-pay | Admitting: Family Medicine

## 2024-01-20 LAB — HM COLONOSCOPY

## 2024-01-31 NOTE — Progress Notes (Unsigned)
 SABRA

## 2024-01-31 NOTE — Progress Notes (Unsigned)
 ESS 8.  (All marked at #1).

## 2024-02-01 ENCOUNTER — Telehealth (INDEPENDENT_AMBULATORY_CARE_PROVIDER_SITE_OTHER): Admitting: Adult Health

## 2024-02-01 DIAGNOSIS — G4733 Obstructive sleep apnea (adult) (pediatric): Secondary | ICD-10-CM

## 2024-02-01 NOTE — Progress Notes (Signed)
 PATIENT: Kevin [name] DOB: [birth year]  REASON FOR VISIT: follow up HISTORY FROM: patient   Virtual Visit via Video Note  I connected with Kevin [name] on 02/01/24 at  1:30 PM EDT by a video enabled telemedicine application located remotely at [facility] and verified that I am speaking with the correct person using two identifiers who was located at their own home in [place]   I discussed the limitations of evaluation and management by telemedicine and the availability of in person appointments. The patient expressed understanding and agreed to proceed.   PATIENT: Kevin [name] DOB: 1960/09/09  REASON FOR VISIT: follow up HISTORY FROM: patient  HISTORY OF PRESENT ILLNESS: Today 02/01/24:  Kevin [name] is a 63 y.o. male with a history of obstructive sleep apnea on CPAP. Returns today for follow-up.he reports that CPAP is working well for him.  He denies any new issues.  He currently wears the full face mask.  Has 2 L of oxygen bled into his CPAP at night.  Returns today for an evaluation.     01/22/23: Kevin [name] is a 63 y.o. male with a history of OSA on CPAP. Returns today for follow-up.  Overall he reports that the CPAP is working well.  He does have 2 L of oxygen bled into his CPAP at night.  Denies any new issues.  Download is below      REVIEW OF SYSTEMS: Out of a complete 14 system review of symptoms, the patient complains only of the following symptoms, and all other reviewed systems are negative.  ALLERGIES: No Known Allergies  HOME MEDICATIONS: Outpatient Medications Prior to Visit  Medication Sig Dispense Refill   acetaminophen (TYLENOL) 500 MG tablet Take 500 mg by mouth every 6 (six) hours as needed.     amitriptyline  (ELAVIL ) 25 MG tablet TAKE 1 TABLET(25 MG) BY MOUTH AT BEDTIME AS NEEDED FOR SLEEP 90 tablet 3   Ascorbic Acid (VITAMIN C) 1000 MG tablet Take 1,000 mg by mouth daily.     aspirin 81 MG tablet Take  81 mg by mouth daily.       Dulaglutide  (TRULICITY ) 4.5 MG/0.5ML SOAJ Inject 4.5 mg into the skin once a week. 6 mL 3   lisinopril  (ZESTRIL ) 20 MG tablet TAKE 1 TABLET(20 MG) BY MOUTH DAILY 90 tablet 3   metFORMIN  (GLUCOPHAGE -XR) 500 MG 24 hr tablet Take 2 tablets (1,000 mg total) by mouth daily with breakfast. 180 tablet 1   mirtazapine  (REMERON ) 15 MG tablet TAKE 1 TABLET(15 MG) BY MOUTH AT BEDTIME 30 tablet 2   Multiple Vitamins-Minerals (MULTIVITAMIN WITH MINERALS) tablet Take 1 tablet by mouth daily.     rosuvastatin  (CRESTOR ) 40 MG tablet Take 1 tablet (40 mg total) by mouth daily. 90 tablet 3   tamsulosin  (FLOMAX ) 0.4 MG CAPS capsule TAKE 1 CAPSULE(0.4 MG) BY MOUTH DAILY 90 capsule 3   No facility-administered medications prior to visit.    PAST MEDICAL HISTORY: Past Medical History:  Diagnosis Date   Diabetes mellitus without complication (HCC) 04/12/2019   Hyperlipidemia    Hypertension    Obesity     PAST SURGICAL HISTORY: No past surgical history on file.  FAMILY HISTORY: Family History  Problem Relation Age of Onset   Heart attack Mother    Coronary artery disease Mother    COPD Father    Diabetes Father     SOCIAL HISTORY: Social History   Socioeconomic History   Marital status:  Married    Spouse name: Debbie   Number of children: Not on file   Years of education: Not on file   Highest education level: Associate degree: occupational, Scientist, product/process development, or vocational program  Occupational History   Occupation: MILITARY    Employer: [employer]    Employer: [employer]    Employer: [employer]    Comment: (bought out [employer])  Tobacco Use   Smoking status: Former    Current packs/day: 0.00    Average packs/day: 1 pack/day for 15.0 years (15.0 ttl pk-yrs)    Types: Cigarettes    Start date: 06/28/2003    Quit date: 06/27/2018    Years since quitting: 5.6   Smokeless tobacco: Never  Substance and Sexual Activity   Alcohol use: No    Alcohol/week: 0.0 standard drinks  of alcohol   Drug use: No   Sexual activity: Not on file  Other Topics Concern   Not on file  Social History Narrative   Former [employer]86-2009, E7   Caffeine 4-5 cups daily avg.   Married no kids.    [employer], now [employer]   Social Drivers of Health   Financial Resource Strain: Low Risk  (07/26/2023)   Overall Financial Resource Strain (CARDIA)    Difficulty of Paying Living Expenses: Not very hard  Food Insecurity: Unknown (07/26/2023)   Hunger Vital Sign    Worried About Running Out of Food in the Last Year: Patient declined    Ran Out of Food in the Last Year: Never true  Transportation Needs: No Transportation Needs (07/26/2023)   PRAPARE - Administrator, Civil Service (Medical): No    Lack of Transportation (Non-Medical): No  Physical Activity: Insufficiently Active (07/26/2023)   Exercise Vital Sign    Days of Exercise per Week: 3 days    Minutes of Exercise per Session: 10 min  Stress: Stress Concern Present (07/26/2023)   [employer] of Occupational Health - Occupational Stress Questionnaire    Feeling of Stress : Rather much  Social Connections: Unknown (01/31/2024)   Social Connection and Isolation Panel    Frequency of Communication with Friends and Family: Not on file    Frequency of Social Gatherings with Friends and Family: Not on file    Attends Religious Services: Not on file    Active Member of Clubs or Organizations: No    Attends Banker Meetings: Not on file    Marital Status: Not on file  Intimate Partner Violence: Not on file      PHYSICAL EXAM Generalized: Well developed, in no acute distress   Neurological examination  Mentation: Alert oriented to time, place, history taking. Follows all commands speech and language fluent Cranial nerve II-XII: Facial symmetry noted  DIAGNOSTIC DATA (LABS, IMAGING, TESTING) - I reviewed patient records, labs, notes, testing and imaging myself where available.  Lab Results  Component Value Date    WBC 7.1 08/23/2023   HGB 15.2 08/23/2023   HCT 44.8 08/23/2023   MCV 89.2 08/23/2023   PLT 214.0 08/23/2023      Component Value Date/Time   NA 137 08/23/2023 1214   K 4.2 08/23/2023 1214   CL 103 08/23/2023 1214   CO2 26 08/23/2023 1214   GLUCOSE 223 (H) 08/23/2023 1214   BUN 13 08/23/2023 1214   CREATININE 1.00 08/23/2023 1214   CALCIUM  9.1 08/23/2023 1214   PROT 6.8 08/23/2023 1214   ALBUMIN 3.9 08/23/2023 1214   AST 19 08/23/2023 1214  ALT 34 08/23/2023 1214   ALKPHOS 66 08/23/2023 1214   BILITOT 0.6 08/23/2023 1214   GFRNONAA 110.09 12/31/2008 0936   Lab Results  Component Value Date   CHOL 109 04/12/2023   HDL 27.40 (L) 04/12/2023   LDLCALC 46 04/12/2023   LDLDIRECT 55.0 03/10/2019   TRIG 178.0 (H) 04/12/2023   CHOLHDL 4 04/12/2023   Lab Results  Component Value Date   HGBA1C 7.3 (A) 12/27/2023     ASSESSMENT AND PLAN 63 y.o. year old male  has a past medical history of Diabetes mellitus without complication (HCC) (04/12/2019), Hyperlipidemia, Hypertension, and Obesity. here with:  OSA on CPAP  CPAP compliance excellent Residual AHI is good Encouraged patient to continue using CPAP nightly and > 4 hours each night F/U in 1 year or sooner if needed    Duwaine Russell, MSN, NP-C 02/01/2024, 12:46 PM [facility] [address] [place], [place] [address] [phone]

## 2024-02-01 NOTE — Progress Notes (Deleted)
     Kevin [name] T. Kevin Zorn, MD, CAQ Sports Medicine [facility] at [facility] [address] [place], [address]  Phone: [phone]  FAX: [phone]  Kevin [name] - 63 y.o. male  MRN [id number]  Date of Birth: May 04, 1961  Date: 02/02/2024  PCP: Watt Mirza, MD  Referral: Watt Mirza, MD  No chief complaint on file.  Subjective:   Kevin [name] is a 63 y.o. very pleasant male patient with There is no height or weight on file to calculate BMI. who presents with the following:  Discussed the use of AI scribe software for clinical note transcription with the patient, who gave verbal consent to proceed.  Patient is here for follow-up on microalbumin testing which showed a microalbumin creatinine ratio 508 from December 27, 2023. History of Present Illness     Review of Systems is noted in the HPI, as appropriate  Objective:   There were no vitals taken for this visit.  GEN: No acute distress; alert,appropriate. PULM: Breathing comfortably in no respiratory distress PSYCH: Normally interactive.   Physical Exam   Laboratory and Imaging Data:  Assessment and Plan:   No diagnosis found. Assessment & Plan   Medication Management during today's office visit: No orders of the defined types were placed in this encounter.  There are no discontinued medications.  Orders placed today for conditions managed today: No orders of the defined types were placed in this encounter.   Disposition: No follow-ups on file.  Dr[employer] One speech-to-text software was used for transcription in this dictation.  Possible transcriptional errors can occur using Animal nutritionist.   Signed,  Mirza DASEN. Jyair Kiraly, MD   Outpatient Encounter Medications as of 02/02/2024  Medication Sig   acetaminophen (TYLENOL) 500 MG tablet Take 500 mg by mouth every 6 (six) hours as needed.   amitriptyline  (ELAVIL ) 25 MG tablet TAKE 1 TABLET(25 MG) BY MOUTH AT  BEDTIME AS NEEDED FOR SLEEP   Ascorbic Acid (VITAMIN C) 1000 MG tablet Take 1,000 mg by mouth daily.   aspirin 81 MG tablet Take 81 mg by mouth daily.     Dulaglutide  (TRULICITY ) 4.5 MG/0.5ML SOAJ Inject 4.5 mg into the skin once a week.   lisinopril  (ZESTRIL ) 20 MG tablet TAKE 1 TABLET(20 MG) BY MOUTH DAILY   metFORMIN  (GLUCOPHAGE -XR) 500 MG 24 hr tablet Take 2 tablets (1,000 mg total) by mouth daily with breakfast.   mirtazapine  (REMERON ) 15 MG tablet TAKE 1 TABLET(15 MG) BY MOUTH AT BEDTIME   Multiple Vitamins-Minerals (MULTIVITAMIN WITH MINERALS) tablet Take 1 tablet by mouth daily.   rosuvastatin  (CRESTOR ) 40 MG tablet Take 1 tablet (40 mg total) by mouth daily.   tamsulosin  (FLOMAX ) 0.4 MG CAPS capsule TAKE 1 CAPSULE(0.4 MG) BY MOUTH DAILY   No facility-administered encounter medications on file as of 02/02/2024.

## 2024-02-01 NOTE — Patient Instructions (Signed)
 Continue using CPAP nightly and greater than 4 hours each night If your symptoms worsen or you develop new symptoms please let us  know.

## 2024-02-02 ENCOUNTER — Telehealth: Payer: Self-pay

## 2024-02-02 ENCOUNTER — Telehealth: Payer: Self-pay | Admitting: Family Medicine

## 2024-02-02 ENCOUNTER — Other Ambulatory Visit (INDEPENDENT_AMBULATORY_CARE_PROVIDER_SITE_OTHER)

## 2024-02-02 ENCOUNTER — Ambulatory Visit: Admitting: Family Medicine

## 2024-02-02 DIAGNOSIS — E119 Type 2 diabetes mellitus without complications: Secondary | ICD-10-CM

## 2024-02-02 LAB — MICROALBUMIN / CREATININE URINE RATIO
Creatinine,U: 90.9 mg/dL
Microalb Creat Ratio: 867.3 mg/g — ABNORMAL HIGH (ref 0.0–30.0)
Microalb, Ur: 78.8 mg/dL — ABNORMAL HIGH (ref 0.0–1.9)

## 2024-02-02 NOTE — Telephone Encounter (Signed)
 Pt came by and dropped off colonscopy results form. The form will be in provider's folder upfront.

## 2024-02-02 NOTE — Telephone Encounter (Signed)
 Placed results in Dr Copland's box.

## 2024-02-02 NOTE — Telephone Encounter (Signed)
 Per Dr Watt, pt was to return for lab visit only in 1 mo for rpt malb urine, due to previous abnormal results. Wanted to inform pt.   Spoke with pt's wife, Marval (on dpr), notifying her of info above. She verbalizes understanding and informed pt. Schedule lab visit today at 11:30.

## 2024-02-04 MED ORDER — EMPAGLIFLOZIN 10 MG PO TABS: 10.0000 mg | ORAL_TABLET | Freq: Every day | ORAL | 3 refills | Status: DC

## 2024-02-04 NOTE — Progress Notes (Signed)
 Very elevated microalbumin to creatinine ratio with still a very good looking kidney function by blood and GFR.  I am going to start him on Jardiance  at 10 mg, and at follow-up if his blood sugars are significantly lower, we could consider increasing the Jardiance  to 25 and lowering or getting rid of his metformin .

## 2024-02-04 NOTE — Addendum Note (Signed)
 Addended by: WATT MIRZA on: 02/04/2024 11:20 AM   Modules accepted: Orders

## 2024-02-09 ENCOUNTER — Encounter: Payer: Self-pay | Admitting: Family Medicine

## 2024-03-14 ENCOUNTER — Ambulatory Visit (INDEPENDENT_AMBULATORY_CARE_PROVIDER_SITE_OTHER)

## 2024-03-14 DIAGNOSIS — Z23 Encounter for immunization: Secondary | ICD-10-CM | POA: Diagnosis not present

## 2024-03-28 ENCOUNTER — Telehealth: Payer: Self-pay | Admitting: Adult Health

## 2024-03-28 NOTE — Telephone Encounter (Signed)
 Pt wife called stating that [facility] need  updated office visit for Pt to continue to receive Cpap supplies

## 2024-03-28 NOTE — Telephone Encounter (Signed)
 Pt just had visit on 02/01/24. I have faxed the notes over to [facility]. Received a receipt of confirmation.

## 2024-03-28 NOTE — Telephone Encounter (Signed)
 Message also sent to [facility].

## 2024-04-04 ENCOUNTER — Other Ambulatory Visit: Payer: Self-pay | Admitting: Family Medicine

## 2024-04-04 ENCOUNTER — Ambulatory Visit: Payer: Self-pay

## 2024-04-04 MED ORDER — MELOXICAM 15 MG PO TABS: 15.0000 mg | ORAL_TABLET | Freq: Every day | ORAL | 1 refills | Status: DC

## 2024-04-04 NOTE — Telephone Encounter (Signed)
 FYI Only or Action Required?: Action required by provider: Med request.  Patient was last seen in primary care on 12/27/2023 by Watt Mirza, MD.  Called Nurse Triage reporting Generalized Body Aches.  Symptoms began several years ago.  Interventions attempted: OTC medications: Tylenol.  Symptoms are: gradually worsening.  Triage Disposition: See PCP Within 2 Weeks  Patient/caregiver understands and will follow disposition?: Unsure             Copied from CRM [id number]. Topic: Clinical - Red Word Triage >> Apr 04, 2024 11:18 AM Frederich PARAS wrote: [facility] that prompted transfer to Nurse Triage: pain  aches and pains in ankles,thighs ,legs, knees, in chest area, feel llike he pulled something in chest.no cold. Reason for Disposition  Body pains are a chronic symptom (recurrent or ongoing AND present > 4 weeks)  Answer Assessment - Initial Assessment Questions 1. ONSET: When did the muscle aches or body pains start?      Ongoing for years but worsening in the last 3-4 weeks.  2. LOCATION: What part of your body is hurting? (e.g., entire body, arms, legs)      In ankles, thighs, legs, knees,and in chest area  3. SEVERITY: How bad is the pain? (Scale 1-10; or mild, moderate, severe)     More than moderate but not severe  4. CAUSE: What do you think is causing the pains?     Arthritis and symptoms are worsening due to the cold weather  5. FEVER: Do you have a fever? If Yes, ask: What is your temperature, how was it measured, and  when did it start?      Denies  6. OTHER SYMPTOMS: Do you have any other symptoms? (e.g., chest pain, cold or flu symptoms, rash, weakness, weight loss)     Denies   This RN spoke with pt's wife and pt regarding symptoms.Currently taking Tylenol with no relief and states his PCP is aware of symptoms and does not want an appointment in office.  States his Meloxicam  medication was discontinued and is requesting for it to be sent to  pharmacy below .Pt prefers a Clinical Cytogeneticist message regarding the request.   [id number] [place], [place] - [address] AT [facility] OF [address]  [address] [place], [place] [address]  Protocols used: Muscle Aches and Body Pain-A-AH

## 2024-04-07 NOTE — Telephone Encounter (Signed)
 Pt wife called stating that [facility]/ [facility] called and Informed Pt that they have received Pt order  for a O2 concentrator , however [facility] stated that Pt would need a sleep study done before they can give Pt supplies .  Pt wife is concern and would like to go over with Nurse .

## 2024-04-10 NOTE — Telephone Encounter (Signed)
 Sent community message to [facility] to see what exactly is needed. Pt last ONO 07/2018. Order placed by Dr. Chalice 08/11/2018: Oxygen at 2 liters to be bled into CPAP nocturnal use only( sleep time use only)..  Waiting on response

## 2024-04-12 NOTE — Telephone Encounter (Signed)
 We have not received update on this from [facility]. Sent high priority community message to [facility] asking for update. Waiting on response.

## 2024-04-13 NOTE — Telephone Encounter (Addendum)
 Update: [name] [name], RN; Tucker, Dolanda; Neysa Nena RAMAN, RN; [place] Heather LITTIE, RN I will reach out to the recert team and see if that will work. Will let you know what they say

## 2024-04-13 NOTE — Telephone Encounter (Signed)
 Replied asking if pt can do ONO instead, waiting on response.

## 2024-04-18 NOTE — Telephone Encounter (Signed)
 Sent community message to [facility] again asking for update, waiting on response

## 2024-04-19 NOTE — Telephone Encounter (Signed)
 Called [facility] at [phone], had to LVM asking they call back.

## 2024-04-25 NOTE — Telephone Encounter (Signed)
 RE: Oxygen Received: Today [name], RN; [name] you asked if a overnight oxygen test can be used to recertify. Has this been done do you have the test results     Previous Messages    ----- Message ----- From: [name], RN Sent: 04/25/2024  10:38 AM EST To: [name] Subject: FW: Oxygen                                    Hey what's going on? We havent heard.  Pt is waiting.  Particia RN ----- Message ----- From: [name] Sent: 04/18/2024  11:50 AM EST To: [name]; [name], RN; [phone]* Subject: RE: Oxygen                                    Haven't heard anything back....just sent a follow up email to check status ----- Message ----- From: [name], RN Sent: 04/18/2024  10:26 AM EST To: [name]; [name], RN; [phone]* Subject: RE: Oxygen                                    Any updates on this?  Thank you! [name],RN ----- Message ----- From: [name] Sent: 04/13/2024   9:23 AM EST To: [name]; [name], RN; [phone]* Subject: RE: Oxygen                                    I will reach out to the recert team and see if that will work. Will let you know what they say ----- Message ----- From: [name], RN Sent: 04/13/2024   8:53 AM EST To: [name]; [name], RN; [phone]* Subject: RE: Oxygen                                    Hello,  She uses oxygen bled into CPAP at night only. Can she do just an overnight oxygen test to get re-certification?  Thank you, [name],RN ----- Message ----- From: [name] Sent: 04/12/2024   4:45 PM EST To: [name]; [name]; [name] Subject: RE: Oxygen                                    Ok so the message I got from recertification team....says  in order to continue with 7mo restart, we are in need of testing: either RAAR or ----- Message ----- From: [name], RN Sent:  04/12/2024   9:51 AM EST To: [name]; [name]; [name]* Subject: RE: Oxygen                                    Hello,  Just checked in to see if you all have any updates on this? Thank you!  [name],RN ----- Message ----- From: [name] Sent: 04/10/2024  11:22 AM EST To: [name];  [name]; [name] Subject: RE: Oxygen                                    This would be for recertification of O2 I will send everything to that team and have them reach out and let you know what is needed ----- Message ----- From: [name], RN Sent: 04/10/2024  11:15 AM EST To: [name]; [name]; [name] Subject: Oxygen                                        Hello,  The wife of Kevin [name] DOB 2061/05/02 called today saying he needed updated SS for O2. I reviewed his chart. Last ONO looks to be around 08/11/2018? Dr. Chalice placed order at that time: Oxygen at 2 liters to be bled into CPAP nocturnal use only( sleep time use only).  You all told wife he would need new sleep study to show need to get updated oxygen concentrator.  Wanted to run this by you all first to confirm and see what exactly is needed.  Thank you, Emm

## 2024-04-26 ENCOUNTER — Other Ambulatory Visit: Payer: Self-pay | Admitting: *Deleted

## 2024-04-26 DIAGNOSIS — G4733 Obstructive sleep apnea (adult) (pediatric): Secondary | ICD-10-CM

## 2024-04-26 NOTE — Telephone Encounter (Signed)
 [name], RN; [place], Dolanda [place] per Eleanor Hutchens  We just need the titration chart on this patient from his SS on 07/03/2014 that shows at optimal pressure the patient still desated.  We will also need f50f notes within the last 12 months and a new order and that is all. So if you can get us  that we can get the patient finished up for recertification     Previous Messages

## 2024-04-27 NOTE — Progress Notes (Signed)
 RE: fax # Received: Today [name], RN; [name] [place]...no problem     Previous Messages    ----- Message ----- From: [name], RN Sent: 04/27/2024   9:23 AM EST To: [name] Subject: FW: fax #                                      I'm faxing.  16pgs.  Appreciate all your help.   Particia RN ----- Message ----- From: [name] Sent: 04/27/2024   9:18 AM EST To: [name]; Nena GORMAN Neysa, RN Subject: RE: fax #                                      You can fax [phone] and put attention Dolanda or you can email it directly to me [email or web address] either way will work ----- Message ----- From: [name], RN Sent: 04/27/2024   9:00 AM EST To: [name] Subject: fax #                                          Good morning,  what fax # do you want me to use for this information you need?  Kevin [name] Male, 63 y.o., [birth year] MRN: [id number]   Particia

## 2024-04-27 NOTE — Telephone Encounter (Signed)
 RE: fax # Received: Today [name], RN; [name] [place]...no problem     Previous Messages    ----- Message ----- From: [name], RN Sent: 04/27/2024   9:23 AM EST To: [name] Subject: FW: fax #                                      I'm faxing.  16pgs.  Appreciate all your help.   Particia RN ----- Message ----- From: [name] Sent: 04/27/2024   9:18 AM EST To: [name]; Nena GORMAN Neysa, RN Subject: RE: fax #                                      You can fax [phone] and put attention Dolanda or you can email it directly to me [email or web address] either way will work ----- Message ----- From: [name], RN Sent: 04/27/2024   9:00 AM EST To: [name] Subject: fax #                                          Good morning,  what fax # do you want me to use for this information you need?  Vitali G. Diltz Male, 63 y.o., [birth year] MRN: [id number]   Particia

## 2024-04-27 NOTE — Telephone Encounter (Signed)
 Faxed to [name] at [facility] to [phone] SS/ order/ 16pgs.

## 2024-05-02 ENCOUNTER — Other Ambulatory Visit: Payer: Self-pay | Admitting: Family Medicine

## 2024-05-13 ENCOUNTER — Other Ambulatory Visit: Payer: Self-pay | Admitting: Family Medicine

## 2024-06-06 ENCOUNTER — Telehealth: Payer: Self-pay | Admitting: *Deleted

## 2024-06-06 ENCOUNTER — Other Ambulatory Visit: Payer: Self-pay | Admitting: Family Medicine

## 2024-06-06 DIAGNOSIS — E7800 Pure hypercholesterolemia, unspecified: Secondary | ICD-10-CM

## 2024-06-06 DIAGNOSIS — E119 Type 2 diabetes mellitus without complications: Secondary | ICD-10-CM

## 2024-06-06 DIAGNOSIS — Z79899 Other long term (current) drug therapy: Secondary | ICD-10-CM

## 2024-06-06 DIAGNOSIS — Z125 Encounter for screening for malignant neoplasm of prostate: Secondary | ICD-10-CM

## 2024-06-06 NOTE — Telephone Encounter (Signed)
-----   Message from Harlene Du sent at 06/06/2024  3:27 PM EST ----- Regarding: Lab Wed 06/21/24 Hello,  Patient is coming in for CPE labs. Can we get orders please.   Thanks

## 2024-06-13 ENCOUNTER — Other Ambulatory Visit: Payer: Self-pay | Admitting: Family Medicine

## 2024-06-20 ENCOUNTER — Other Ambulatory Visit: Payer: Self-pay | Admitting: Family Medicine

## 2024-06-21 ENCOUNTER — Other Ambulatory Visit: Payer: Self-pay | Admitting: Family Medicine

## 2024-06-21 ENCOUNTER — Other Ambulatory Visit

## 2024-06-21 DIAGNOSIS — E119 Type 2 diabetes mellitus without complications: Secondary | ICD-10-CM | POA: Diagnosis not present

## 2024-06-21 DIAGNOSIS — E7800 Pure hypercholesterolemia, unspecified: Secondary | ICD-10-CM | POA: Diagnosis not present

## 2024-06-21 DIAGNOSIS — Z79899 Other long term (current) drug therapy: Secondary | ICD-10-CM

## 2024-06-21 DIAGNOSIS — Z125 Encounter for screening for malignant neoplasm of prostate: Secondary | ICD-10-CM

## 2024-06-21 LAB — CBC WITH DIFFERENTIAL/PLATELET
Basophils Absolute: 0 10*3/uL (ref 0.0–0.1)
Basophils Relative: 0.5 % (ref 0.0–3.0)
Eosinophils Absolute: 0.3 10*3/uL (ref 0.0–0.7)
Eosinophils Relative: 4.5 % (ref 0.0–5.0)
HCT: 44.9 % (ref 39.0–52.0)
Hemoglobin: 15.5 g/dL (ref 13.0–17.0)
Lymphocytes Relative: 27.6 % (ref 12.0–46.0)
Lymphs Abs: 1.8 10*3/uL (ref 0.7–4.0)
MCHC: 34.4 g/dL (ref 30.0–36.0)
MCV: 87.8 fl (ref 78.0–100.0)
Monocytes Absolute: 0.7 10*3/uL (ref 0.1–1.0)
Monocytes Relative: 11.2 % (ref 3.0–12.0)
Neutro Abs: 3.6 10*3/uL (ref 1.4–7.7)
Neutrophils Relative %: 56.2 % (ref 43.0–77.0)
Platelets: 219 10*3/uL (ref 150.0–400.0)
RBC: 5.12 Mil/uL (ref 4.22–5.81)
RDW: 14.1 % (ref 11.5–15.5)
WBC: 6.5 10*3/uL (ref 4.0–10.5)

## 2024-06-21 LAB — BASIC METABOLIC PANEL WITH GFR
BUN: 21 mg/dL (ref 6–23)
CO2: 27 meq/L (ref 19–32)
Calcium: 9.8 mg/dL (ref 8.4–10.5)
Chloride: 108 meq/L (ref 96–112)
Creatinine, Ser: 0.92 mg/dL (ref 0.40–1.50)
GFR: 89.12 mL/min
Glucose, Bld: 94 mg/dL (ref 70–99)
Potassium: 4.3 meq/L (ref 3.5–5.1)
Sodium: 141 meq/L (ref 135–145)

## 2024-06-21 LAB — LIPID PANEL
Cholesterol: 78 mg/dL (ref 28–200)
HDL: 28.1 mg/dL — ABNORMAL LOW
LDL Cholesterol: 30 mg/dL (ref 10–99)
NonHDL: 49.95
Total CHOL/HDL Ratio: 3
Triglycerides: 101 mg/dL (ref 10.0–149.0)
VLDL: 20.2 mg/dL (ref 0.0–40.0)

## 2024-06-21 LAB — HEPATIC FUNCTION PANEL
ALT: 20 U/L (ref 3–53)
AST: 16 U/L (ref 5–37)
Albumin: 4.2 g/dL (ref 3.5–5.2)
Alkaline Phosphatase: 51 U/L (ref 39–117)
Bilirubin, Direct: 0.1 mg/dL (ref 0.1–0.3)
Total Bilirubin: 0.5 mg/dL (ref 0.2–1.2)
Total Protein: 6.8 g/dL (ref 6.0–8.3)

## 2024-06-21 LAB — HEMOGLOBIN A1C: Hgb A1c MFr Bld: 5.8 % (ref 4.6–6.5)

## 2024-06-21 MED ORDER — EMPAGLIFLOZIN 10 MG PO TABS: 10.0000 mg | ORAL_TABLET | Freq: Every day | ORAL | 0 refills | Status: AC

## 2024-06-23 LAB — PSA, TOTAL WITH REFLEX TO PSA, FREE: PSA, Total: 1 ng/mL

## 2024-06-26 ENCOUNTER — Other Ambulatory Visit: Payer: Self-pay | Admitting: Family Medicine

## 2024-06-27 ENCOUNTER — Encounter: Payer: Self-pay | Admitting: Family Medicine

## 2024-06-28 ENCOUNTER — Encounter: Payer: Self-pay | Admitting: Family Medicine

## 2024-06-28 ENCOUNTER — Ambulatory Visit: Admitting: Family Medicine

## 2024-06-28 VITALS — BP 110/70 | HR 108 | Temp 97.8°F | Ht 64.5 in | Wt 268.0 lb

## 2024-06-28 DIAGNOSIS — I10 Essential (primary) hypertension: Secondary | ICD-10-CM

## 2024-06-28 DIAGNOSIS — Z7984 Long term (current) use of oral hypoglycemic drugs: Secondary | ICD-10-CM | POA: Diagnosis not present

## 2024-06-28 DIAGNOSIS — E7800 Pure hypercholesterolemia, unspecified: Secondary | ICD-10-CM

## 2024-06-28 DIAGNOSIS — Z0000 Encounter for general adult medical examination without abnormal findings: Secondary | ICD-10-CM | POA: Diagnosis not present

## 2024-06-28 DIAGNOSIS — G4733 Obstructive sleep apnea (adult) (pediatric): Secondary | ICD-10-CM | POA: Diagnosis not present

## 2024-06-28 DIAGNOSIS — Z6841 Body Mass Index (BMI) 40.0 and over, adult: Secondary | ICD-10-CM | POA: Diagnosis not present

## 2024-06-28 DIAGNOSIS — Z7985 Long-term (current) use of injectable non-insulin antidiabetic drugs: Secondary | ICD-10-CM

## 2024-06-28 DIAGNOSIS — E119 Type 2 diabetes mellitus without complications: Secondary | ICD-10-CM | POA: Diagnosis not present

## 2024-06-28 MED ORDER — MELOXICAM 15 MG PO TABS: 15.0000 mg | ORAL_TABLET | Freq: Every day | ORAL | 1 refills | Status: AC

## 2024-06-28 MED ORDER — METFORMIN HCL ER 500 MG PO TB24: 500.0000 mg | ORAL_TABLET | Freq: Every day | ORAL | 3 refills | Status: AC

## 2024-06-28 MED ORDER — LISINOPRIL 20 MG PO TABS: 20.0000 mg | ORAL_TABLET | Freq: Every day | ORAL | 3 refills | Status: AC

## 2024-06-28 MED ORDER — TAMSULOSIN HCL 0.4 MG PO CAPS: 0.4000 mg | ORAL_CAPSULE | Freq: Every day | ORAL | 3 refills | Status: AC

## 2024-06-28 MED ORDER — ROSUVASTATIN CALCIUM 40 MG PO TABS: 40.0000 mg | ORAL_TABLET | Freq: Every day | ORAL | 3 refills | Status: AC

## 2024-12-27 ENCOUNTER — Ambulatory Visit: Admitting: Family Medicine

## 2025-01-30 ENCOUNTER — Telehealth: Admitting: Adult Health
